# Patient Record
Sex: Male | Born: 1935 | Race: White | Hispanic: No | Smoking: Former smoker
Health system: Southern US, Community
[De-identification: ages and names within clinical notes are randomized; demographics above are authoritative.]

## PROBLEM LIST (undated history)

## (undated) DIAGNOSIS — I2510 Atherosclerotic heart disease of native coronary artery without angina pectoris: Secondary | ICD-10-CM

## (undated) DIAGNOSIS — I10 Essential (primary) hypertension: Secondary | ICD-10-CM

## (undated) DIAGNOSIS — E7800 Pure hypercholesterolemia, unspecified: Secondary | ICD-10-CM

## (undated) DIAGNOSIS — E538 Deficiency of other specified B group vitamins: Secondary | ICD-10-CM

## (undated) HISTORY — PX: COLONOSCOPY WITH ESOPHAGOGASTRODUODENOSCOPY (EGD): SHX5779

## (undated) HISTORY — PX: CARDIAC CATHETERIZATION: SHX172

## (undated) HISTORY — PX: CORONARY ANGIOPLASTY: SHX604

## (undated) HISTORY — PX: EYE SURGERY: SHX253

## (undated) HISTORY — PX: APPENDECTOMY: SHX54

---

## 2008-02-14 HISTORY — PX: EYE SURGERY: SHX253

## 2014-02-10 ENCOUNTER — Ambulatory Visit: Payer: Self-pay | Admitting: Family Medicine

## 2014-03-23 ENCOUNTER — Ambulatory Visit: Payer: Self-pay | Admitting: Family Medicine

## 2014-07-21 ENCOUNTER — Other Ambulatory Visit: Payer: Self-pay | Admitting: Gastroenterology

## 2014-07-21 DIAGNOSIS — R748 Abnormal levels of other serum enzymes: Secondary | ICD-10-CM

## 2014-09-21 ENCOUNTER — Ambulatory Visit
Admission: RE | Admit: 2014-09-21 | Discharge: 2014-09-21 | Disposition: A | Discharge status: Home / Self Care | Payer: Medicare Other | Source: Ambulatory Visit | Attending: Gastroenterology | Admitting: Gastroenterology

## 2014-09-21 DIAGNOSIS — R748 Abnormal levels of other serum enzymes: Secondary | ICD-10-CM | POA: Diagnosis not present

## 2014-10-02 ENCOUNTER — Other Ambulatory Visit: Payer: Self-pay | Admitting: Gastroenterology

## 2014-10-02 DIAGNOSIS — R935 Abnormal findings on diagnostic imaging of other abdominal regions, including retroperitoneum: Secondary | ICD-10-CM

## 2014-10-13 ENCOUNTER — Ambulatory Visit: Admission: RE | Admit: 2014-10-13 | Payer: Medicare Other | Source: Ambulatory Visit

## 2014-10-13 ENCOUNTER — Ambulatory Visit
Admission: RE | Admit: 2014-10-13 | Discharge: 2014-10-13 | Disposition: A | Discharge status: Home / Self Care | Payer: Medicare Other | Source: Ambulatory Visit | Attending: Gastroenterology | Admitting: Gastroenterology

## 2014-10-13 DIAGNOSIS — Q453 Other congenital malformations of pancreas and pancreatic duct: Secondary | ICD-10-CM | POA: Insufficient documentation

## 2014-10-13 DIAGNOSIS — I700 Atherosclerosis of aorta: Secondary | ICD-10-CM | POA: Insufficient documentation

## 2014-10-13 DIAGNOSIS — R935 Abnormal findings on diagnostic imaging of other abdominal regions, including retroperitoneum: Secondary | ICD-10-CM

## 2014-10-13 MED ORDER — IOHEXOL 350 MG/ML SOLN
100.0000 mL | Freq: Once | INTRAVENOUS | Status: AC | PRN
  Administered 2014-10-13: 100 mL via INTRAVENOUS

## 2014-10-23 ENCOUNTER — Telehealth: Payer: Self-pay

## 2014-10-23 NOTE — Telephone Encounter (Signed)
  Oncology Nurse Navigator Documentation    Navigator Encounter Type: Introductory phone call;Telephone (10/23/14 1400)   Treatment Phase: Abnormal Scans (10/23/14 1400)     Interventions: Coordination of Care (10/23/14 1400)   Coordination of Care: EUS (10/23/14 1400)        Time Spent with Patient: 30 (10/23/14 1400)   EUS scheduled for 11/19/14 with Dr Shana Chute. EUS explained. Pt denies any anticoag use. Educated on instructions for EUS and copy mailed to home address after verifying.   INSTRUCTIONS FOR ENDOSCOPIC ULTRASOUND  -Your procedure has been scheduled for October 6th with Dr Shana Chute at [facility] -The hospital will contact you to pre-register over the phone. If for any reason you have not received a call within one week prior to your scheduled procedure date, please call [phone]. -To get your scheduled arrival time, please call the Endoscopy unit at  [phone] between 1-3pm on: October 5th  -ON THE DAY OF YOU PROCEDURE:  1. If you are scheduled for a morning procedure, nothing to drink after midnight  -If you are scheduled for an afternoon procedure, you may have clear liquids until 5 hours prior  to the procedure but no carbonated drinks or broth  2. NO FOOD THE DAY OF YOUR PROCEDURE  3. You may take your heart, seizure, blood pressure, Parkinson's or breathing medications at  6am with just enough water to get your pills down  4. Do not take any oral Diabetic medications the morning of your procedure.  5. Do not take Vitamins  -On the day of your procedure, come to the [facility] Admitting/Registration desk (First desk on the right) at the scheduled arrival time. You MUST have someone drive you home from your procedure. You must have a responsible adult with a valid drivers license who is on site throughout your entire procedure and who can stay with you for several hours after your procedure. You may not go home alone in a taxi, shuttle [place] or bus, as the  drivers will not be responsible for you.

## 2014-11-18 ENCOUNTER — Encounter: Payer: Self-pay | Admitting: *Deleted

## 2014-11-19 ENCOUNTER — Encounter
Admission: RE | Disposition: A | Discharge status: Home / Self Care | Payer: Self-pay | Source: Ambulatory Visit | Attending: Internal Medicine

## 2014-11-19 ENCOUNTER — Ambulatory Visit: Payer: Medicare Other | Admitting: Anesthesiology

## 2014-11-19 ENCOUNTER — Ambulatory Visit
Admission: RE | Admit: 2014-11-19 | Discharge: 2014-11-19 | Disposition: A | Discharge status: Home / Self Care | Payer: Medicare Other | Source: Ambulatory Visit | Attending: Internal Medicine | Admitting: Internal Medicine

## 2014-11-19 ENCOUNTER — Encounter: Payer: Self-pay | Admitting: *Deleted

## 2014-11-19 DIAGNOSIS — E7800 Pure hypercholesterolemia, unspecified: Secondary | ICD-10-CM | POA: Insufficient documentation

## 2014-11-19 DIAGNOSIS — Z79899 Other long term (current) drug therapy: Secondary | ICD-10-CM | POA: Diagnosis not present

## 2014-11-19 DIAGNOSIS — I252 Old myocardial infarction: Secondary | ICD-10-CM | POA: Insufficient documentation

## 2014-11-19 DIAGNOSIS — I2510 Atherosclerotic heart disease of native coronary artery without angina pectoris: Secondary | ICD-10-CM | POA: Insufficient documentation

## 2014-11-19 DIAGNOSIS — I10 Essential (primary) hypertension: Secondary | ICD-10-CM | POA: Diagnosis not present

## 2014-11-19 DIAGNOSIS — K219 Gastro-esophageal reflux disease without esophagitis: Secondary | ICD-10-CM | POA: Diagnosis not present

## 2014-11-19 DIAGNOSIS — K862 Cyst of pancreas: Secondary | ICD-10-CM | POA: Diagnosis not present

## 2014-11-19 DIAGNOSIS — Z955 Presence of coronary angioplasty implant and graft: Secondary | ICD-10-CM | POA: Insufficient documentation

## 2014-11-19 DIAGNOSIS — Z87891 Personal history of nicotine dependence: Secondary | ICD-10-CM | POA: Diagnosis not present

## 2014-11-19 DIAGNOSIS — Z7982 Long term (current) use of aspirin: Secondary | ICD-10-CM | POA: Insufficient documentation

## 2014-11-19 HISTORY — DX: Essential (primary) hypertension: I10

## 2014-11-19 HISTORY — PX: UPPER ESOPHAGEAL ENDOSCOPIC ULTRASOUND (EUS): SHX6562

## 2014-11-19 HISTORY — DX: Pure hypercholesterolemia, unspecified: E78.00

## 2014-11-19 HISTORY — DX: Atherosclerotic heart disease of native coronary artery without angina pectoris: I25.10

## 2014-11-19 HISTORY — DX: Deficiency of other specified B group vitamins: E53.8

## 2014-11-19 SURGERY — UPPER ESOPHAGEAL ENDOSCOPIC ULTRASOUND (EUS)
Anesthesia: General

## 2014-11-19 MED ORDER — PROPOFOL 10 MG/ML IV BOLUS
INTRAVENOUS | Status: DC | PRN
  Administered 2014-11-19: 40 mg via INTRAVENOUS

## 2014-11-19 MED ORDER — FENTANYL CITRATE (PF) 100 MCG/2ML IJ SOLN
INTRAMUSCULAR | Status: DC | PRN
  Administered 2014-11-19: 50 ug via INTRAVENOUS

## 2014-11-19 MED ORDER — PROPOFOL 500 MG/50ML IV EMUL
INTRAVENOUS | Status: DC | PRN
  Administered 2014-11-19: 120 ug/kg/min via INTRAVENOUS

## 2014-11-19 MED ORDER — SODIUM CHLORIDE 0.9 % IV SOLN
INTRAVENOUS | Status: DC
  Administered 2014-11-19: 08:00:00 via INTRAVENOUS

## 2014-11-19 MED ORDER — MIDAZOLAM HCL 2 MG/2ML IJ SOLN
INTRAMUSCULAR | Status: DC | PRN
  Administered 2014-11-19: 1 mg via INTRAVENOUS

## 2014-11-19 NOTE — Anesthesia Procedure Notes (Signed)
Date/Time: 11/19/2014 8:05 AM Performed by: Junious Silk Pre-anesthesia Checklist: Patient identified, Emergency Drugs available, Suction available, Patient being monitored and Timeout performed Oxygen Delivery Method: Nasal cannula

## 2014-11-19 NOTE — Op Note (Signed)
[facility] Gastroenterology Patient Name: Ricky Russell Procedure Date: 11/19/2014 8:05 AM MRN: [id number] Account #: [id number] Date of Birth: April 03, 1935 Admit Type: Outpatient Age: 79 Room: [facility] ENDO ROOM 3 Gender: Male Note Status: Finalized Procedure:         Upper EUS Indications:       Pancreatic cyst on CT scan Patient Profile:   Refer to note in patient chart for documentation of                     history and physical. Providers:         Prudencio Pair. Katelyn Broadnax Referring MD:      Christena Deem, MD (Referring MD) Medicines:         Propofol per Anesthesia Complications:     No immediate complications. Procedure:         Pre-Anesthesia Assessment:                    Prior to the procedure, a History and Physical was                     performed, and patient medications and allergies were                     reviewed. The patient is competent. The risks and benefits                     of the procedure and the sedation options and risks were                     discussed with the patient. All questions were answered                     and informed consent was obtained. Patient identification                     and proposed procedure were verified by the physician, the                     nurse and the anesthesiologist in the endoscopy suite.                     Mental Status Examination: alert and oriented. Airway                     Examination: normal oropharyngeal airway and neck                     mobility. Respiratory Examination: clear to auscultation.                     CV Examination: normal. Prophylactic Antibiotics: The                     patient does not require prophylactic antibiotics. Prior                     Anticoagulants: The patient has taken no previous                     anticoagulant or antiplatelet agents. ASA Grade                     Assessment: III - A patient with severe systemic disease.  After  reviewing the risks and benefits, the patient was                     deemed in satisfactory condition to undergo the procedure.                     The anesthesia plan was to use monitored anesthesia care                     (MAC). Immediately prior to administration of medications,                     the patient was re-assessed for adequacy to receive                     sedatives. The heart rate, respiratory rate, oxygen                     saturations, blood pressure, adequacy of pulmonary                     ventilation, and response to care were monitored                     throughout the procedure. The physical status of the                     patient was re-assessed after the procedure.                    After obtaining informed consent, the endoscope was passed                     under direct vision. Throughout the procedure, the                     patient's blood pressure, pulse, and oxygen saturations                     were monitored continuously. The EUS GI Linear Array                     [id number] was introduced through the mouth, and advanced to                     the duodenum for ultrasound examination from the                     esophagus, stomach and duodenum. The Endoscope was                     introduced through the mouth, and advanced to the second                     part of duodenum. The upper EUS was accomplished without                     difficulty. The patient tolerated the procedure well. Findings:      Endoscopic Finding :      The examined esophagus was endoscopically normal.      The entire examined stomach was endoscopically normal.      The examined duodenum was endoscopically normal.      Endosonographic Finding :      Anechoic lesions suggestive of three cysts were identified in the neck       (  11.4 mm X 8.6 mm) and tail (8.1 mm X 3.5 mm and 3.9 mm X 3.5 mm) of the       pancreas. There was no associated mass.      There was otherwise no sign  of significant endosonographic parenchymal       or ductal abnormality in the entire pancreas. The pancreatic duct       measured 1.6 mm in the head, 1.3 mm in the neck, and 0.7 mm in the body.      There was no sign of significant endosonographic abnormality in the       common bile duct (3.8 mm), in the common hepatic duct (2.3 mm) and in       the gallbladder.      Endosonographic imaging in the left lobe of the liver showed no       abnormalities.      No lymphadenopathy seen.      The celiac region was visualized and showed no sign of significant       endosonographic abnormality. Impression:        EGD Impressions:                    - Normal esophagus.                    - Normal stomach.                    - Normal examined duodenum.                    EUS Impressions:                    - Three benign appearing small cystic lesions were seen in                     the neck and tail of the pancreas. No high risk features.                     FNA not performed given the small size and lack of high                     risk features.                    - The pancreas was otherwise normal.                    - There was no sign of significant pathology in the common                     bile duct, in the common hepatic duct and in the                     gallbladder. No stones or sludge seen.                    - Normal visualized portions of the liver.                    - Normal celiac region.                    - No lymphadenopathy seen.                    - No specimens  collected. Recommendation:    - Discharge patient to home (ambulatory).                    - Perform magnetic resonance imaging (MRI) with gadolinium                     in 1 year for surveillance of the pancreas cysts.                    - The findings and recommendations were discussed with the                     patient and his family.                    - Return to referring physician as previously  scheduled. Procedure Code(s): --- Professional ---                    [phone], Esophagogastroduodenoscopy, flexible, transoral;                     with endoscopic ultrasound examination, including the                     esophagus, stomach, and either the duodenum or a                     surgically altered stomach where the jejunum is examined                     distal to the anastomosis Diagnosis Code(s): --- Professional ---                    K86.2, Cyst of pancreas CPT copyright 2014 American Medical Association. All rights reserved. The codes documented in this report are preliminary and upon coder review may  be revised to meet current compliance requirements. Attending Participation:      I personally performed the entire procedure without the assistance of a       fellow, resident or surgical assistant. Prudencio Pair Kyanna Mahrt,  11/19/2014 8:31:18 AM This report has been signed electronically. Number of Addenda: 0 Note Initiated On: 11/19/2014 8:05 AM      [facility]

## 2014-11-19 NOTE — Transfer of Care (Signed)
Immediate Anesthesia Transfer of Care Note  Patient: Ricky Russell  Procedure(s) Performed: Procedure(s): UPPER ESOPHAGEAL ENDOSCOPIC ULTRASOUND (EUS) (N/A)  Patient Location: PACU  Anesthesia Type:General  Level of Consciousness: sedated  Airway & Oxygen Therapy: Patient Spontanous Breathing and Patient connected to nasal cannula oxygen  Post-op Assessment: Report given to RN and Post -op Vital signs reviewed and stable  Post vital signs: Reviewed and stable  Last Vitals:  Filed Vitals:   11/19/14 0714  BP: 143/85  Pulse: 64  Temp: 36.4 C  Resp: 12    Complications: No apparent anesthesia complications

## 2014-11-19 NOTE — H&P (Signed)
Ricky Russell is an 79 y.o. male.   Chief Complaint:  Pancreas cyst HPI:  Incidentally seen pancreas cyst.  For planned EUS  Past Medical History  Diagnosis Date  . Hypertension   . Coronary artery disease   . High cholesterol   . Vitamin B 12 deficiency     Past Surgical History  Procedure Laterality Date  . Appendectomy    . Colonoscopy with esophagogastroduodenoscopy (egd)    . Eye surgery      History reviewed. No pertinent family history. Social History:  reports that he has quit smoking. He has never used smokeless tobacco. He reports that he does not drink alcohol or use illicit drugs.  Allergies: No Known Allergies  Medications Prior to Admission  Medication Sig Dispense Refill  . aspirin EC 81 MG tablet Take 81 mg by mouth daily.    . metoprolol succinate (TOPROL-XL) 50 MG 24 hr tablet Take 50 mg by mouth daily. Take with or immediately following a meal.    . ofloxacin (OCUFLOX) 0.3 % ophthalmic solution 1 drop 4 (four) times daily.    . pantoprazole (PROTONIX) 40 MG tablet Take 40 mg by mouth daily.    . prednisoLONE acetate (PRED FORTE) 1 % ophthalmic suspension 1 drop 4 (four) times daily.      No results found for this or any previous visit (from the past 48 hour(s)). No results found.  ROS  Blood pressure 143/85, pulse 64, temperature 97.5 F (36.4 C), temperature source Tympanic, resp. rate 12, height  (1.778 m), weight 79.379 kg (175 lb), SpO2 100 %. Physical Exam  General: NAD Resp: CTAB Cardiovascular: RRR, no murmurs Abdomen: Soft, NT, ND, normal bowel sounds  Assessment/Plan Plan for EUS today  Mike Gip 11/19/2014, 8:00 AM

## 2014-11-19 NOTE — Anesthesia Preprocedure Evaluation (Addendum)
Anesthesia Evaluation  Patient identified by MRN, date of birth, ID band Patient awake    Reviewed: Allergy & Precautions, NPO status   History of Anesthesia Complications Negative for: history of anesthetic complications  Airway Mallampati: II       Dental  (+) Upper Dentures, Chipped, Missing   Pulmonary neg pulmonary ROS, former smoker,           Cardiovascular hypertension, + CAD, + Past MI and + Cardiac Stents       Neuro/Psych negative neurological ROS     GI/Hepatic GERD  Medicated,Pancreatic mass   Endo/Other  negative endocrine ROS  Renal/GU negative Renal ROS     Musculoskeletal   Abdominal   Peds  Hematology negative hematology ROS (+)   Anesthesia Other Findings   Reproductive/Obstetrics                            Anesthesia Physical Anesthesia Plan  ASA: III  Anesthesia Plan: General   Post-op Pain Management:    Induction: Intravenous  Airway Management Planned: Nasal Cannula  Additional Equipment:   Intra-op Plan:   Post-operative Plan:   Informed Consent: I have reviewed the patients History and Physical, chart, labs and discussed the procedure including the risks, benefits and alternatives for the proposed anesthesia with the patient or authorized representative who has indicated his/her understanding and acceptance.     Plan Discussed with:   Anesthesia Plan Comments:         Anesthesia Quick Evaluation

## 2014-11-19 NOTE — Anesthesia Postprocedure Evaluation (Signed)
  Anesthesia Post-op Note  Patient: Ricky Russell  Procedure(s) Performed: Procedure(s): UPPER ESOPHAGEAL ENDOSCOPIC ULTRASOUND (EUS) (N/A)  Anesthesia type:General  Patient location: PACU  Post pain: Pain level controlled  Post assessment: Post-op Vital signs reviewed, Patient's Cardiovascular Status Stable, Respiratory Function Stable, Patent Airway and No signs of Nausea or vomiting  Post vital signs: Reviewed and stable  Last Vitals:  Filed Vitals:   11/19/14 0900  BP: 143/68  Pulse: 61  Temp:   Resp: 17    Level of consciousness: awake, alert  and patient cooperative  Complications: No apparent anesthesia complications

## 2014-11-23 ENCOUNTER — Encounter: Payer: Self-pay | Admitting: Internal Medicine

## 2015-02-16 ENCOUNTER — Ambulatory Visit: Admit: 2015-02-16 | Payer: Medicare Other | Admitting: Gastroenterology

## 2015-02-16 SURGERY — COLONOSCOPY WITH PROPOFOL
Anesthesia: General

## 2015-11-10 ENCOUNTER — Other Ambulatory Visit: Payer: Self-pay | Admitting: Gastroenterology

## 2015-11-10 DIAGNOSIS — R945 Abnormal results of liver function studies: Principal | ICD-10-CM

## 2015-11-10 DIAGNOSIS — K869 Disease of pancreas, unspecified: Secondary | ICD-10-CM

## 2015-11-10 DIAGNOSIS — R7989 Other specified abnormal findings of blood chemistry: Secondary | ICD-10-CM

## 2015-11-23 ENCOUNTER — Ambulatory Visit
Admission: RE | Admit: 2015-11-23 | Discharge: 2015-11-23 | Disposition: A | Discharge status: Home / Self Care | Payer: Medicare Other | Source: Ambulatory Visit | Attending: Gastroenterology | Admitting: Gastroenterology

## 2015-11-23 ENCOUNTER — Other Ambulatory Visit: Payer: Self-pay | Admitting: Gastroenterology

## 2015-11-23 DIAGNOSIS — K869 Disease of pancreas, unspecified: Secondary | ICD-10-CM | POA: Insufficient documentation

## 2015-11-23 DIAGNOSIS — R945 Abnormal results of liver function studies: Secondary | ICD-10-CM

## 2015-11-23 DIAGNOSIS — R7989 Other specified abnormal findings of blood chemistry: Secondary | ICD-10-CM | POA: Insufficient documentation

## 2015-11-23 MED ORDER — GADOBENATE DIMEGLUMINE 529 MG/ML IV SOLN
15.0000 mL | Freq: Once | INTRAVENOUS | Status: AC | PRN
  Administered 2015-11-23: 15 mL via INTRAVENOUS

## 2017-03-23 NOTE — Telephone Encounter (Signed)
Had rec'd voicemail message (Thurs 02/07) from Larina Earthly at [place] office. Ms. Tresa Endo had rec'd a phone call from this Pt asking if he could possibly get an earlier appointment.  Rec'd 2nd message from Ms. Selm today.  Attempted to call Pt to verify if he would be willing to see a different Physician at the [facility] location.  LVM asking that Pt to return my call, letting us know if he wanted to see Dr. Lutricia Feil only.  Name and phone # provided.

## 2017-03-29 ENCOUNTER — Ambulatory Visit: Admit: 2017-03-29 | Discharge: 2017-04-03 | Payer: MEDICARE | Attending: Neurology

## 2017-03-29 DIAGNOSIS — G20 Parkinson's disease: Secondary | ICD-10-CM

## 2017-03-29 MED ORDER — carbidopa-levodopa (SINEMET) 25-100 mg per tablet
25-100 | ORAL_TABLET | Freq: Three times a day (TID) | ORAL | 3 refills | Status: AC
Start: 2017-03-29 — End: 2017-06-14

## 2017-03-29 NOTE — Unmapped (Signed)
Subjective:      Patient ID: William Russell is a 82 y.o. male.    Movement Disorders HPI       Handedness: Right  Chief Complaint: tremor and gait  Patient accompanied by and additional history obtained from: wife and daughter  Age at onset of symptoms: 57  Duration of symptoms: 2 years    The patient presents for an initial visit for tremor and gait difficulties with his wife and daughter. His wife noticed a tremor in his cheek a couple of years ago when he is eating or talking. He noticed a left leg tremor in 05/2016 at rest.  After this he noticed his balance started to worsen and he started taking shorter steps. [place] Turns became more difficult. He developed a tremor in right right> left hand a few months later. The hand tremor is mostly with action. He has difficulty with fine motor movements of his right hand. His handwriting is smaller. His wife feels his smell may be less sensitive. He is having more drooling. He has had dream enactment behaviors for years. He reports mild short term memory issues. He can still do his ADLS and drives without difficulty. He has developed urinary urgency and incontinence since the summer.     He has had more depression and anxiety since his wife was diagnosed with breast cancer in April of 2018.       PD Quality Metrics  Falls (Review Each Visit): present      Depression (Review Annually): present     Apathy (Review Annually): present     Anxiety (Review Annually): present     Cognitive Impairment (Review Annually): present     Orthostatic Hypotension (Review Annually): denies     Insomnia/Sleep Disturbance (Review Annually): denies       Other Symptoms  Gait Freezing: denies     Urinary Incontinence: present      Dysphagia: denies     Dream Enactment Behavior: present     Anosmia: present     Constipation: present     Hypophonia: present     Hypomimia: present     Micrographia: present        Med Side Effects  Nausea: denies     Vomiting: denies      Lightheadedness: denies     Fainting: denies     Daytime Sedation: denies     Dyskinesia: denies     Hallucinations: denies     Delusions: denies     Paranoia: denies     Impulse Control Disorder: denies       Imaging  denies    Etiol Risk  Encephalitis: denies     Medications: denies     Head Trauma: denies     Environmental Toxins: present  [place] farm       Histories:     He has a past medical history of Depression; Hypertension; and OSA (obstructive sleep apnea).    He has no past surgical history on file.    His family history includes Cancer in his mother; Coronary artery disease in his mother; Other in his father.    He reports that he has never smoked. He has never used smokeless tobacco. He reports that he does not drink alcohol or use drugs.    Review of Systems  Refer to HPI for review of systems documentation.  See scanned patient data form for additional ROS information.  Allergies:   Patient has no known allergies.    Medications:     Outpatient Encounter Prescriptions as of 03/29/2017   Medication Sig Dispense Refill   ??? aspirin 325 MG tablet Take 325 mg by mouth daily.     ??? atorvastatin (LIPITOR) 20 MG tablet TAKE 1 TABLET BY MOUTH ONE TIME A DAY AT BEDTIME  3   ??? buPROPion XL (WELLBUTRIN XL) 150 MG 24 hr tablet      ??? busPIRone (BUSPAR) 15 MG tablet Take 30 mg by mouth daily.               ??? calcium citrate-vitamin D (CITRACAL+D) 315-200 mg-unit Take 1 tablet by mouth daily.     ??? folic acid (FOLVITE) 400 MCG tablet Take 400 mcg by mouth daily.     ??? glucosamine-chondroitin 500-400 mg tablet Take 1 tablet by mouth daily.     ??? metoprolol succinate (TOPROL-XL) 25 MG 24 hr tablet Take 25 mg by mouth daily.     ??? montelukast (SINGULAIR) 10 mg tablet      ??? multivit-min/FA/lycopen/lutein (CENTRUM SILVER MEN ORAL) Take by mouth.     ??? niacin 100 MG tablet Take 100 mg by mouth daily with breakfast.     ??? omega-3 fatty acids-fish oil 300-1,000 mg capsule Take 1 capsule by mouth daily.     ???  omeprazole (PRILOSEC) 20 MG capsule      ??? PARoxetine (PAXIL) 20 MG tablet Take 20 mg by mouth every morning.     ??? saw palmetto 500 MG capsule Take 500 mg by mouth daily.       No facility-administered encounter medications on file as of 03/29/2017.        Objective:         Vitals:    03/29/17 1336   BP: 122/78   Pulse: 72   Weight: 204 lb 3.2 oz (92.6 kg)   Height: 5' 8 (1.727 m)       Neurologic Exam     Mental Status   Oriented to person, place, and time.   Oriented to place.   Oriented to year.   Speech: speech is normal   Level of consciousness: arousable by verbal stimuli ,  responsive to painful stimuli    Cranial Nerves     CN III, IV, VI   Pupils are equal, round, and reactive to light.  Extraocular motions are normal.     CN VIII   Hearing: impaired    Motor Exam     Strength   Strength 5/5 throughout. Tremor of jaw and right face/cheek which worsens with smile.      Sensory Exam   Light touch normal.     Gait, Coordination, and Reflexes     Gait  Gait: (decreased stride length, decreased arm swing)    Coordination   Finger to nose coordination: normal    Tremor   Resting tremor: present    Reflexes   Right brachioradialis: 2+  Left brachioradialis: 2+  Right biceps: 2+  Left biceps: 2+  Right patellar: 3+  Left patellar: 3+  Right achilles: 2+  Left achilles: 2+  Right ankle clonus: absent  Left ankle clonus: absent        Physical Exam   Constitutional: He is oriented to person, place, and time. He appears well-developed and well-nourished.   HENT:   Head: Normocephalic and atraumatic.   Eyes: Pupils are equal, round, and reactive to light. Conjunctivae and EOM  are normal.   Neck: Normal range of motion.   Pulmonary/Chest: Effort normal. No respiratory distress.   Abdominal: He exhibits no distension.   Musculoskeletal: Normal range of motion. He exhibits no edema.   Neurological: He is oriented to person, place, and time. He has normal strength. He has a normal Finger-Nose-Finger Test.   Reflex  Scores:       Bicep reflexes are 2+ on the right side and 2+ on the left side.       Brachioradialis reflexes are 2+ on the right side and 2+ on the left side.       Patellar reflexes are 3+ on the right side and 3+ on the left side.       Achilles reflexes are 2+ on the right side and 2+ on the left side.  Skin: Skin is warm and dry.   Psychiatric: He has a normal mood and affect. His speech is normal.       MDS UPDRS part 3 - Motor Examination     General  3.1 Speech: 1  3.2 Facial Expression: 1  Rigidity  3.3a Rigidity- Neck: 2  3.3b Rigidity right upper: 1  3.3c Rigidity left upper: 1  3.3d Rigidity right lower: 0  3.3e Rigidity left lower: 0  Coordination   3.4a Finger tapping, right : 2  3.4b Finger tapping, left: 3  3.5a Hand movement, right: 3  3.5b Hand movement, left: 2  3.6a Pronation-supination of right hand: 2  3.6b Pronation-supination of left hand: 2  3.7a Toe tapping, right : 1  3.7b Toe Tapping, left: 2  3.8a Leg agility, right: 1  3.8b Leg agility, left: 1  Posture and Gait  3.9 Arising from chair : 0  3.10 Gait: 1  3.11 Freezing of gait: 0  3.12 Postural Stability: 0  3.13 Posture: 1  3.14 Body bradykinesia: 2  Tremor  3.15a Postural tremor, right hand: 0  3.15b Postural Tremor, left hand: 0  3.16a Kinetic Tremor of right hand: 1  3.16b Kinetic tremor of left hand: 1  3.17e Lip/jaw rest tremor: 1  3.17a RUE Rest Tremor Amplitude: 0  3.17b LUE Rest Tremor Amplitude: 0  3.17c RLE Rest Tremor Amplitude: 1  3.17d LLE Rest Tremor Amplitude: 0  3.17e Constancy of Rest tremor: 1     MDS UPDRS 3 Total: 34        EMG 08/2012: IMPRESSION: Above findings are suggestive of a 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 There is evidence of axonal loss along the right abductor pollicis brevis. There is no EMG evidence of cervical radiculopathy. There is no evidence of brachial plexopathy. There is no evidence of generalized  peripheral neuropathy, myopathy, or motor neuron disease on today's examination. The patient tolerated the procedure well.       Assessment:     1. Idiopathic Parkinson's disease (CMS Dx)      The patient has parkinsonism in the form of asymmetric resting tremor, bradykinesia and mild rigidity with symptom onset about 1 year ago with an unknown response to levodopa which in the absence of atypical features likely represents idiopathic Parkinson's disease. 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     Plan:   1. Start Sinemet 25/100 mg 1/2 tablet TID and increase by 1/2 tablet per dose every 2 weeks until taking 2 tablets TID. Discussed potential side effects  to be aware of.   2. Encouraged regular exercise.   3. If he does not respond to Sinemet or develops atypical features would recommend brain MRI as he has not had any brain imaging to date.   4. We did not have time to do a MoCA today however will do at a future visit to assess cognition further (patient only with mild short term memory complaints currently, no impact on ADLs).   5. Follow-up with Shela Nevin CNP in 10 weeks.            I have spent 90 minutes face to face with this patient with greater than 50% of this time spent in counseling and coordination of care discussing the above issues.  See Assessment and Plan for details.

## 2017-04-18 NOTE — Unmapped (Signed)
Please fax urology referral to [facility] at [phone] (I placed referral in epic).

## 2017-04-18 NOTE — Telephone Encounter (Signed)
Urology referral was faxed to [facility] and a fax confirmation was received.

## 2017-04-24 ENCOUNTER — Ambulatory Visit

## 2017-06-14 ENCOUNTER — Ambulatory Visit: Admit: 2017-06-14 | Discharge: 2017-08-23 | Payer: MEDICARE | Attending: Adult Health

## 2017-06-14 DIAGNOSIS — G20 Parkinson's disease: Secondary | ICD-10-CM

## 2017-06-14 MED ORDER — polyethylene glycol (GLYCOLAX) 17 gram/dose powder
17 | Freq: Every day | ORAL | 0 refills | 14.00000 days | Status: AC
Start: 2017-06-14 — End: 2018-03-25

## 2017-06-14 MED ORDER — carbidopa-levodopa (SINEMET) 25-100 mg per tablet
25-100 | ORAL_TABLET | Freq: Three times a day (TID) | ORAL | 3 refills | Status: AC
Start: 2017-06-14 — End: 2017-11-20

## 2017-06-14 NOTE — Unmapped (Signed)
Subjective:      Patient ID: William Russell is a 82 y.o. male.    Movement Disorders HPI       Handedness: Right  Chief Complaint: tremor and gait  Patient accompanied by and additional history obtained from: wife and daughter  Age at onset of symptoms: 81  Duration of symptoms: 2 years    William Russell presents for followup.  He last saw Dr. Gerilyn Pilgrim in February for initial evaluation of tremor and gait difficulties. During that visit we started Sinemet 2 tablets three times a day. He says the tremor in his cheek, lower extremity and hand tremor has improved. He says he can button his shirt better.  His wife says she has to him put his coat on. He says his balance is better.  He says he has a internal tremor. He says his handwriting has not improved. He says it is small.  He says has he has occasional lightheaded if he gets up too quickly. He has wearing off 30 minutes before the next dose with staggering gait, internal tremor and stiffness.     He goes to PT and PD exercise class 3 days a week and finds this has helped his balance and gait. He denies any new falls.    He has occasional incontinence and urgency incontinence that is quite bothersome. He has constipation. He says his short-term memory is bad. 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         Initial evaluation:  The patient presents for an initial visit for tremor and gait difficulties with his wife and daughter. His wife noticed a tremor in his cheek a couple of years ago when he is eating or talking. He noticed a left leg tremor in 05/2016 at rest.  After this he noticed his balance started to worsen and he started taking shorter steps. [place] Turns became more difficult. He developed a tremor in right right> left hand a few months later. The hand tremor is mostly with action. He has difficulty with fine motor movements of his right hand. His handwriting is smaller. His wife feels his smell may be less  sensitive. He is having more drooling. He has had dream enactment behaviors for years. He reports mild short term memory issues. He can still do his ADLS and drives without difficulty. He has developed urinary urgency and incontinence since the summer.   ??  He has had more depression and anxiety since his wife was diagnosed with breast cancer in April of 2018.     PD Quality Metrics  Falls (Review Each Visit): present           Depression (Review Annually): present     Apathy (Review Annually): present     Anxiety (Review Annually): present     Cognitive Impairment (Review Annually): present  short term memory  Orthostatic Hypotension (Review Annually): denies     Insomnia/Sleep Disturbance (Review Annually): denies             Other Symptoms  Gait Freezing: denies           Urinary Incontinence: present  incontinence and urgency        Dysphagia: denies     Dream Enactment Behavior: present     Anosmia: present     Constipation: present     Hypophonia: present     Hypomimia: present     Micrographia:  present        Med Side Effects  Nausea: denies     Vomiting: denies     Lightheadedness: denies     Fainting: denies     Daytime Sedation: denies     Dyskinesia: denies     Hallucinations: denies     Delusions: denies     Paranoia: denies     Impulse Control Disorder: denies       Motor Fluctuations        Wearing Off Prior to Next Dose: 30 minutes    Imaging  denies       Histories:     He has a past medical history of Depression; Hypertension; and OSA (obstructive sleep apnea).    He has no past surgical history on file.    His family history includes Cancer in his mother; Coronary artery disease in his mother; Other in his father.    He reports that he has never smoked. He has never used smokeless tobacco. He reports that he does not drink alcohol or use drugs.    Review of Systems  Refer to HPI for review of systems documentation.  ROS was otherwise non-contributory.     Allergies:   Patient has no known  allergies.    Medications:     Outpatient Encounter Prescriptions as of 06/14/2017   Medication Sig Dispense Refill   ??? allopurinol (ZYLOPRIM) 300 MG tablet Take 300 mg by mouth daily.     ??? aspirin 325 MG tablet Take 325 mg by mouth daily.     ??? atorvastatin (LIPITOR) 20 MG tablet TAKE 1 TABLET BY MOUTH ONE TIME A DAY AT BEDTIME  3   ??? buPROPion XL (WELLBUTRIN XL) 150 MG 24 hr tablet      ??? calcium citrate-vitamin D (CITRACAL+D) 315-200 mg-unit Take 1 tablet by mouth daily.     ??? carbidopa-levodopa (SINEMET) 25-100 mg per tablet Take 3 tablets by mouth 3 times a day. 810 tablet 3   ??? fluticasone propionate (FLONASE) 50 mcg/actuation nasal spray Use 1 spray into each nostril at bedtime.     ??? folic acid (FOLVITE) 400 MCG tablet Take 400 mcg by mouth daily.     ??? glucosamine-chondroitin 500-400 mg tablet Take 1 tablet by mouth daily.     ??? loratadine (CLARITIN) 10 mg tablet Take 10 mg by mouth daily as needed for Allergies.     ??? metoprolol succinate (TOPROL-XL) 25 MG 24 hr tablet Take 25 mg by mouth daily.     ??? montelukast (SINGULAIR) 10 mg tablet      ??? multivit-min/FA/lycopen/lutein (CENTRUM SILVER MEN ORAL) Take by mouth.     ??? niacin 100 MG tablet Take 100 mg by mouth daily with breakfast.     ??? omega-3 fatty acids-fish oil 300-1,000 mg capsule Take 1 capsule by mouth daily.     ??? omeprazole (PRILOSEC) 20 MG capsule      ??? PARoxetine (PAXIL) 20 MG tablet Take 20 mg by mouth every morning.     ??? saw palmetto 500 MG capsule Take 500 mg by mouth daily.     ??? [DISCONTINUED] carbidopa-levodopa (SINEMET) 25-100 mg per tablet Take 2 tablets by mouth 3 times a day. 540 tablet 3   ??? busPIRone (BUSPAR) 15 MG tablet Take 30 mg by mouth daily.               ??? polyethylene glycol (GLYCOLAX) 17 gram/dose powder Take 17 g by mouth daily. 255  g 0     No facility-administered encounter medications on file as of 06/14/2017.        Objective:         Vitals:    06/14/17 1306   BP: 122/70   Pulse: 67   Weight: 213 lb 9.6 oz (96.9 kg)    Height: 5' 8 (1.727 m)       MDS UPDRS part 3 - Motor Examination  MDS UPDRS III Motor Examination  Date medication last taken: 06/14/17  Time medication last taken : 1415  Medication : OFF  General  3.1 Speech: 1  3.2 Facial Expression: 1  Rigidity  3.3a Rigidity- Neck: 2  3.3b Rigidity right upper: 1  3.3c Rigidity left upper: 0  3.3d Rigidity right lower: 0  3.3e Rigidity left lower: 0  Coordination   3.4a Finger tapping, right : 2  3.4b Finger tapping, left: 3  3.5a Hand movement, right: 2  3.5b Hand movement, left: 2  3.6a Pronation-supination of right hand: 2  3.6b Pronation-supination of left hand: 2  3.7a Toe tapping, right : 1  3.7b Toe Tapping, left: 2  3.8a Leg agility, right: 1  3.8b Leg agility, left: 1  Posture and Gait  3.9 Arising from chair : 0  3.10 Gait: 1  3.11 Freezing of gait: 0  3.12 Postural Stability: 1  3.13 Posture: 1  3.14 Body bradykinesia: 2  Tremor  3.15a Postural tremor, right hand: 0  3.15b Postural Tremor, left hand: 1  3.16a Kinetic Tremor of right hand: 0  3.16b Kinetic tremor of left hand: 1  3.17e Lip/jaw rest tremor: 0  3.17a RUE Rest Tremor Amplitude: 0  3.17b LUE Rest Tremor Amplitude: 0  3.17c RLE Rest Tremor Amplitude: 0  3.17d LLE Rest Tremor Amplitude: 1  3.17e Constancy of Rest tremor: 1     MDS UPDRS 3 Total: 32  Comments: decreased bilateral arm swing       Assessment:     1. Parkinson's disease (CMS Dx)    2. Urinary incontinence, unspecified type    3. Urgency incontinence    4. 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 He has rigidity, mild tremor and shugffling gait that warrants  an increase in Sinemet.  He has urinary incontinence and urgency that are bothersome. He has short-term memory concerns. His MOCA today is 22/30. He has constipation.     Plan:     1.  Increase Sinemet from 2 tabs three times a day to 3 tabs three times a day, to help with rigidity, bradykinesia and tremor.  2.  Continue PT and PD exercises as he is doing to help with balance, gait, endurance. We discussed the importance of exercise in Parkinson's Disease, and that regular physical activity will help the symptoms of the disease and it is possible that it could slow the progression of the disease overtime. He verbalized understanding.  3.  Medical message referral provided to help with rigidity.  4.  Urology referral to Dr. Ree Shay provided.  5.  Miralax 1 cap full daily as needed for constipation.  6.  Follow-up with Lenn Sink, CNP  in 3 months and Dr. Gerilyn Pilgrim in 6 months.        I have spent 90 minutes face to face with this patient with greater than 50% of this time spent in counseling and coordination of care discussing the above issues.  See Assessment and Plan for details.    Lenn Sink, CNP

## 2017-06-14 NOTE — Patient Instructions (Addendum)
1.  Increase Sinemet to 3 tabs three times a day   Week one Sinemet 2.5 tab three times a day  Week two sinemet 3 tabs three time a day  2.  Urology referrral to Dr. Ree Shay  3.  Miralax 1 cap a daily to help with constipation  4. Continue exercising as he is doing  5.  Medical message therapy  6. Pt for for balance, endurance, and mobility.  7. Follow-up with Shela Nevin in 3 months and Dr. Gerilyn Pilgrim in 6 months.

## 2017-07-05 NOTE — Telephone Encounter (Signed)
Zollie Scale PT fr [facility], is calling to inform that pt is having other concerns and would benefit from getting OT as well. Asking for an OT order to be faxed.         Fax [phone]

## 2017-07-06 NOTE — Telephone Encounter (Signed)
OT referral has been faxed over to [facility] with good confirmation, call complete.

## 2017-07-20 NOTE — Telephone Encounter (Signed)
error 

## 2017-09-20 ENCOUNTER — Ambulatory Visit: Admit: 2017-09-20 | Payer: MEDICARE | Attending: Adult Health

## 2017-09-20 DIAGNOSIS — G20 Parkinson's disease: Secondary | ICD-10-CM

## 2017-09-20 NOTE — Unmapped (Signed)
Subjective:      Patient ID: William Russell is a 82 y.o. male.    Movement Disorders HPI       Handedness: Right  Chief Complaint: tremor and gait  Patient accompanied by and additional history obtained from: wife and daughter  Age at onset of symptoms: 54  Duration of symptoms: 2 years    William Russell presents for followup.  He last seen in June and at that visit we increased the Sinemet from 2 tabs three times a day to 3 tabs three times a day (6-7a, 12-2p, 7-10p). He says his balance and posture has improved. He reports the tremor in his cheek, right hand tremor has improved. He recently started using weighted utensils and he finds it helpful. His handwriting has improved.  He has a left lower extremity internal tremor that is bothersome.  He says has he has occasional lightheaded if he gets up too quickly. He has occasional wearing if late taking the next dose with return of internal tremor.     He goes to PT/OT and [employer] exercise class 3 days a week and finds this helpful. He denies any new falls.   ??  He has urgency incontinence. He has an appointment with William Russell soon. He has constipation that is bothersome. He says his short-term memory is bad. 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    He had one ER visit in June for abdominal pain. He is scheduled for a cholecystectomy on August 26 th.    He is interested in Parkinson's Disease clinic trials.     Initial evaluation:  The patient presents for an initial visit for tremor and gait difficulties with his wife and daughter. His wife noticed a tremor in his cheek a couple of years ago when he is eating or talking. He noticed a left leg tremor in 05/2016 at rest. ??After this he noticed his balance started to worsen and he started taking shorter steps. He has had 3 falls [place] his farm. Turns became more difficult. He developed a tremor in right right>??left hand a few months later. The hand tremor  is mostly with action. He has difficulty with fine motor movements of his right hand. His handwriting is smaller. His wife feels his smell may be less sensitive. He is having more drooling. He has had dream enactment behaviors for years. He reports mild short term memory issues. He can still do his ADLS and drives without difficulty. He has developed urinary urgency and incontinence since the summer.   ??  He has had more depression and anxiety since his wife was diagnosed with breast cancer in April of 2018.       PD Quality Metrics  Falls (Review Each Visit): present  in the past  Motor Complications (Review Each Visit): present  internal tremor  Depression (Review Annually): present     Apathy (Review Annually): present     Anxiety (Review Annually): present     Cognitive Impairment (Review Annually): present  short term memory  Orthostatic Hypotension (Review Annually): denies     Insomnia/Sleep Disturbance (Review Annually): denies     PD Dx Review (Review Annually): yes     PD Med and Surg Option (Review Annually): yes     PD Rehab Options (Review Annually): yes     PD Safety Counseling (Review Annually): yes       Other  Symptoms  Gait Freezing: denies     Excessive Sweating: denies     Urinary Incontinence: present  urgency        Dysphagia: denies     Dream Enactment Behavior: present     Anosmia: present     Constipation: present     Hypophonia: present     Hypomimia: present     Micrographia: present        Med Side Effects  Nausea: denies     Vomiting: denies     Lightheadedness: denies     Fainting: denies     Daytime Sedation: denies     Dyskinesia: denies     Hallucinations: denies     Delusions: denies     Paranoia: denies     Impulse Control Disorder: denies       Motor Fluctuations        Wearing Off Prior to Next Dose: if late with the next dose.     Imaging  denies       Histories:     He has a past medical history of Depression; Hypertension; and OSA (obstructive sleep apnea).    He has no past  surgical history on file.    His family history includes Cancer in his mother; Coronary artery disease in his mother; Other in his father.    He reports that he has never smoked. He has never used smokeless tobacco. He reports that he does not drink alcohol or use drugs.    Review of Systems  Refer to HPI for review of systems documentation.  ROS was otherwise non-contributory.     Allergies:   Patient has no known allergies.    Medications:     Outpatient Encounter Prescriptions as of 09/20/2017   Medication Sig Dispense Refill   ??? allopurinol (ZYLOPRIM) 300 MG tablet Take 300 mg by mouth daily.     ??? aspirin 325 MG tablet Take 325 mg by mouth daily.     ??? atorvastatin (LIPITOR) 20 MG tablet TAKE 1 TABLET BY MOUTH ONE TIME A DAY AT BEDTIME  3   ??? buPROPion XL (WELLBUTRIN XL) 150 MG 24 hr tablet      ??? calcium citrate-vitamin D (CITRACAL+D) 315-200 mg-unit Take 1 tablet by mouth daily.     ??? carbidopa-levodopa (SINEMET) 25-100 mg per tablet Take 3 tablets by mouth 3 times a day. 810 tablet 3   ??? fluticasone propionate (FLONASE) 50 mcg/actuation nasal spray Use 1 spray into each nostril at bedtime.     ??? folic acid (FOLVITE) 400 MCG tablet Take 400 mcg by mouth daily.     ??? glucosamine-chondroitin 500-400 mg tablet Take 1 tablet by mouth daily.     ??? loratadine (CLARITIN) 10 mg tablet Take 10 mg by mouth daily as needed for Allergies.     ??? metoprolol succinate (TOPROL-XL) 25 MG 24 hr tablet Take 25 mg by mouth daily.     ??? montelukast (SINGULAIR) 10 mg tablet      ??? multivit-min/FA/lycopen/lutein (CENTRUM SILVER MEN ORAL) Take by mouth.     ??? niacin 100 MG tablet Take 200 mg by mouth 2 times a day with meals.            ??? omega-3 fatty acids-fish oil 300-1,000 mg capsule Take 1 capsule by mouth daily.     ??? omeprazole (PRILOSEC) 20 MG capsule      ??? PARoxetine (PAXIL) 20 MG tablet Take 20 mg by mouth every morning.     ???  polyethylene glycol (GLYCOLAX) 17 gram/dose powder Take 17 g by mouth daily. 255 g 0   ??? saw  palmetto 500 MG capsule Take 500 mg by mouth daily.       No facility-administered encounter medications on file as of 09/20/2017.        Objective:         Vitals:    09/20/17 1309   BP: (P) 122/62   Pulse: (P) 70   Weight: 194 lb (88 kg)   Height: 5' 8 (1.727 m)       MDS UPDRS part 3 - Motor Examination  MDS UPDRS III Motor Examination  Date medication last taken: 09/20/17  Time medication last taken : 1250  Medication taken : Sinemet  Medication : ON  General  3.1 Speech: 1  3.2 Facial Expression: 1  Rigidity  3.3a Rigidity- Neck: 1  3.3b Rigidity right upper: 1  3.3c Rigidity left upper: 0  3.3d Rigidity right lower: 0  3.3e Rigidity left lower: 0  Coordination   3.4a Finger tapping, right : 2  3.4b Finger tapping, left: 3  3.5a Hand movement, right: 1  3.5b Hand movement, left: 1  3.6a Pronation-supination of right hand: 1  3.6b Pronation-supination of left hand: 2  3.7a Toe tapping, right : 1  3.7b Toe Tapping, left: 2  3.8a Leg agility, right: 1  3.8b Leg agility, left: 2  Posture and Gait  3.9 Arising from chair : 0  3.10 Gait: 1  3.11 Freezing of gait: 0  3.12 Postural Stability: 1 (3 steps)  3.13 Posture: 1  3.14 Body bradykinesia: 1  Tremor  3.15a Postural tremor, right hand: 0  3.15b Postural Tremor, left hand: 1  3.16a Kinetic Tremor of right hand: 0  3.16b Kinetic tremor of left hand: 1  3.17e Lip/jaw rest tremor: 0  3.17a RUE Rest Tremor Amplitude: 0  3.17b LUE Rest Tremor Amplitude: 0  3.17c RLE Rest Tremor Amplitude: 0  3.17d LLE Rest Tremor Amplitude: 1 (twitching of his calf)  3.17e Constancy of Rest tremor: 1     MDS UPDRS 3 Total: 28  Comments: improved bilateral arm swing       Assessment:     1. Parkinson's disease (CMS Dx)    2. Urgency incontinence    3. 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 He has mild internal tremor that is variable, mostly if late taking a dose of Sinemet.  He has urinary urgency that is bothersome. He has short-term memory concerns. His MOCA is 22/30. He has constipation that has improved on Miralax.       Plan:     1. Continue Sinemet 3 tablets three times a day. Encouraged to take the Sinemet at normal times and regular intervals.  2. Continue exercise as he is doing. We discussed the importance of exercise in Parkinson's Disease, and that regular physical activity will help the symptoms of the disease and it is possible that it could slow the progression of the disease overtime. He verbalized understanding.  3.  He is interested in PD clinical trials. Will coordinated with our research team.  4.  Follow-up with Dr. Gerilyn Pilgrim in 3 months.          I have spent 45 minutes face to face with this patient with greater than 50% of this time spent in counseling and coordination of care discussing the above issues.  See Assessment and Plan for details.    Lenn Sink, CNP

## 2017-09-20 NOTE — Unmapped (Addendum)
1.  Continue Sinemet 3 tablets three times a day. Encouraged to take the Sinemet at normal times and regular intervals. 7a-12p-5p.  2.  Continue Miralax as he is doing.  3. Continue exercising as he doing.  4. Avoid anti nausea medications such as phenergan and compazine. If post-op nausea is present please treat with Zofran.

## 2017-10-22 ENCOUNTER — Ambulatory Visit: Admit: 2017-10-22 | Payer: MEDICARE

## 2017-10-22 DIAGNOSIS — G20 Parkinson's disease: Secondary | ICD-10-CM

## 2017-10-22 LAB — POC URINALYSIS
Bilirubin, UA: NEGATIVE mg/dL
Blood, POC, UA: NEGATIVE
Glucose, POC, UA: NEGATIVE mg/dL
Ketones, POC, UA: NEGATIVE mg/dL
Leukocytes, POC, UA: NEGATIVE
Nitrite, POC, UA: NEGATIVE
Protein, POC, UA: NEGATIVE mg/dL
Spec Grav, UA: 1.015 (ref 1.005–1.035)
Urobilinogen, POC, UA: 0.2 EU/dL (ref 0.2–1.0)
pH, POC,UA: 7 (ref 5.0–8.0)

## 2017-10-22 MED ORDER — finasteride (PROSCAR) 5 mg tablet
5 | ORAL_TABLET | Freq: Every day | ORAL | 0 refills | Status: AC
Start: 2017-10-22 — End: 2017-12-21

## 2017-10-22 MED ORDER — trospium (SANCTURA XR) 60 mg Cp24
60 | ORAL_CAPSULE | Freq: Every day | ORAL | 0 refills | Status: AC
Start: 2017-10-22 — End: 2017-12-24

## 2017-10-22 MED ORDER — tamsulosin (FLOMAX) 0.4 mg Cap
0.4 | ORAL_CAPSULE | Freq: Every evening | ORAL | 0 refills | Status: AC
Start: 2017-10-22 — End: 2017-12-21

## 2017-10-22 NOTE — Progress Notes (Signed)
Chief Complaint   Patient presents with    New Patient Visit/ Consultation        History of Present Illness  81y/o male with history of PD (03/2017), OSA, constipation, depression.    Reports urinary urgency and UUI for the last several months.   Frequency 3-4x daily. Nocturia 1x.   Small volume urinary leakage 4-5x daily. Uses pads 60% of days. Changes approx once daily. Always change pad before going to bed.   Denies dysuria, abdominal pain, hesitancy. Strong urine stream.   Feels like he does not fully empty his bladder with urination, and has to bear down to completely empty at the end of urination.     Reports constipation due to carvidopa.     PVR today 16       Review of Systems   Constitutional: Negative for chills, diaphoresis, fatigue and fever.   Respiratory: Negative for cough, chest tightness, shortness of breath and wheezing.    Cardiovascular: Negative for chest pain, palpitations and leg swelling.   Gastrointestinal: Positive for constipation. Negative for abdominal distention, abdominal pain, diarrhea, heartburn and nausea.   Genitourinary: Positive for frequency, nocturia and urgency. Negative for dysuria, flank pain, hematuria, scrotal swelling and testicular pain.   Musculoskeletal: Positive for arthralgias. Negative for back pain, gait problem, joint swelling and myalgias.       Allergies  Patient has no known allergies.    Medications  Outpatient Encounter Prescriptions as of 10/22/2017   Medication Sig Dispense Refill    allopurinol (ZYLOPRIM) 300 MG tablet Take 300 mg by mouth daily.      aspirin 325 MG tablet Take 325 mg by mouth daily.      atorvastatin (LIPITOR) 20 MG tablet TAKE 1 TABLET BY MOUTH ONE TIME A DAY AT BEDTIME  3    buPROPion XL (WELLBUTRIN XL) 150 MG 24 hr tablet       calcium citrate-vitamin D (CITRACAL+D) 315-200 mg-unit Take 1 tablet by mouth daily.      carbidopa-levodopa (SINEMET) 25-100 mg per tablet Take 3 tablets by mouth 3 times a day. 810 tablet 3     fluticasone propionate (FLONASE) 50 mcg/actuation nasal spray Use 1 spray into each nostril at bedtime.      folic acid (FOLVITE) 400 MCG tablet Take 400 mcg by mouth daily.      glucosamine-chondroitin 500-400 mg tablet Take 1 tablet by mouth daily.      loratadine (CLARITIN) 10 mg tablet Take 10 mg by mouth daily as needed for Allergies.      metoprolol succinate (TOPROL-XL) 25 MG 24 hr tablet Take 25 mg by mouth daily.      montelukast (SINGULAIR) 10 mg tablet       multivit-min/FA/lycopen/lutein (CENTRUM SILVER MEN ORAL) Take by mouth.      niacin 100 MG tablet Take 200 mg by mouth 2 times a day with meals.             omega-3 fatty acids-fish oil 300-1,000 mg capsule Take 1 capsule by mouth daily.      omeprazole (PRILOSEC) 20 MG capsule       PARoxetine (PAXIL) 20 MG tablet Take 20 mg by mouth every morning.      saw palmetto 500 MG capsule Take 500 mg by mouth daily.      polyethylene glycol (GLYCOLAX) 17 gram/dose powder Take 17 g by mouth daily. 255 g 0     No facility-administered encounter medications on file as of 10/22/2017.  Histories  He has a past medical history of Depression; Hypertension; and OSA (obstructive sleep apnea).    He has a past surgical history that includes Shoulder surgery; Lumbar spine surgery; Cholecystectomy; and Carpal tunnel release.    His family history includes Cancer in his mother; Coronary artery disease in his mother; Other in his father.    He reports that he has never smoked. He has never used smokeless tobacco. He reports that he does not drink alcohol or use drugs.    The following portions of the patient's history were reviewed and updated as appropriate: allergies, current medications, past family history, past medical history, past social history, past surgical history and problem list.    Blood pressure 112/62, pulse 77, height 5' 7.5 (1.715 m), weight 194 lb (88 kg).  Physical Exam   Vitals reviewed.  Constitutional: He is oriented to person,  place, and time. He appears well-developed and well-nourished.   HENT:   Head: Normocephalic and atraumatic.   Eyes: Pupils are equal, round, and reactive to light.   Neck: Normal range of motion. Neck supple. No tracheal deviation present.   Cardiovascular: Normal rate, regular rhythm and normal heart sounds.    Pulmonary/Chest: Effort normal and breath sounds normal. No respiratory distress. He has no wheezes. He has no rales. He exhibits no tenderness.   Abdominal: Soft. Bowel sounds are normal. He exhibits no distension and no mass. There is no tenderness. There is no rebound.   Genitourinary: Uncircumcised.   Genitourinary Comments: Small R hydrocele   Musculoskeletal: Normal range of motion.   Neurological: He is alert and oriented to person, place, and time.   Skin: Skin is warm and dry. No rash noted. No erythema.   Psychiatric: He has a normal mood and affect. His behavior is normal.     DRE: Moderately enlarged prostate.         Assessment  81y/o male with history of PD (03/2017), OSA, constipation, depression referred for UUI.     Reports urinary urgency and urinary leakage for the last several months.   Frequency 3-4x daily. Nocturia 1x.   Small volume urinary leakage 4-5x daily. Uses pads 60% of days. Changes approx once daily. Always change pad before going to bed.   Denies dysuria, abdominal pain, hesitancy. Strong urine stream.   Feels like he does not fully empty his bladder with urination, and has to bear down to completely empty at the end of urination.     DRE: Moderately enlarged prostate.   PVR: 16      Plan  1. Prostate is moderately enlarged on DRE. Prescribed flomax and proscar today.   2. Prescribed trospium to address urinary urgency.   3. Discussed bladder diet with patient and provided information regarding foods than can irritate bladder.   4. Patient will complete voiding diary and bring to next appointment. Will evaluate to see if there are any behavior modifications that may help with  his symptoms.   5. RTC 4-6 weeks. If he is doing well, we will continue current management. If not, will plan for cystoscopy to further evaluate bladder and prostate.        Medical Decision Making  The following items were considered in medical decision making:  Review / order clinical lab tests  Reviewed outside records

## 2017-11-04 ENCOUNTER — Emergency Department: Payer: Medicare Other

## 2017-11-04 ENCOUNTER — Emergency Department
Admission: EM | Admit: 2017-11-04 | Discharge: 2017-11-04 | Disposition: A | Discharge status: Home / Self Care | Payer: Medicare Other | Attending: Emergency Medicine | Admitting: Emergency Medicine

## 2017-11-04 ENCOUNTER — Other Ambulatory Visit: Payer: Self-pay

## 2017-11-04 DIAGNOSIS — Z79899 Other long term (current) drug therapy: Secondary | ICD-10-CM | POA: Insufficient documentation

## 2017-11-04 DIAGNOSIS — Z7982 Long term (current) use of aspirin: Secondary | ICD-10-CM | POA: Insufficient documentation

## 2017-11-04 DIAGNOSIS — Z87891 Personal history of nicotine dependence: Secondary | ICD-10-CM | POA: Diagnosis not present

## 2017-11-04 DIAGNOSIS — M25562 Pain in left knee: Secondary | ICD-10-CM | POA: Diagnosis present

## 2017-11-04 DIAGNOSIS — M25462 Effusion, left knee: Secondary | ICD-10-CM | POA: Diagnosis not present

## 2017-11-04 DIAGNOSIS — Y9389 Activity, other specified: Secondary | ICD-10-CM | POA: Diagnosis not present

## 2017-11-04 DIAGNOSIS — I2510 Atherosclerotic heart disease of native coronary artery without angina pectoris: Secondary | ICD-10-CM | POA: Insufficient documentation

## 2017-11-04 DIAGNOSIS — I10 Essential (primary) hypertension: Secondary | ICD-10-CM | POA: Insufficient documentation

## 2017-11-04 DIAGNOSIS — M7052 Other bursitis of knee, left knee: Secondary | ICD-10-CM | POA: Insufficient documentation

## 2017-11-04 MED ORDER — PREDNISONE 10 MG PO TABS: ORAL_TABLET | ORAL | 0 refills | Status: AC

## 2017-11-04 MED ORDER — HYDROCODONE-ACETAMINOPHEN 5-325 MG PO TABS: 1.0000 | ORAL_TABLET | Freq: Four times a day (QID) | ORAL | 0 refills | Status: AC | PRN

## 2017-11-04 NOTE — Discharge Instructions (Signed)
Follow-up with your primary care provider or make an appointment with the orthopedist listed on your discharge papers or orthopedist of your choice.  Begin taking prednisone 3 tablets once a day for the next 5 days.  Try to take this medication approximately the same time every day.  Norco 1 tablet every 6 hours as needed for pain.  Be aware that this medication could cause drowsiness and increase her risk for injury.  Do not take this medication and drive or operate machinery.  You may also ice and elevate your knee to reduce swelling which will help with pain.

## 2017-11-04 NOTE — ED Triage Notes (Signed)
Pt c/o left knee pain since Friday. 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

## 2017-11-04 NOTE — ED Provider Notes (Signed)
[facility] Provider Note   ____________________________________________   First MD Initiated Contact with Patient 11/04/17 [phone]     (approximate)  I have reviewed the triage vital signs and the nursing notes.   HISTORY  Chief Complaint Knee Pain   HPI Ricky Russell is a 82 y.o. male Mod[place] Jansky to the ED with complaint of left knee pain.  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  He has been noticing a clicking noise in the past and has had problems.  This first time he has been seen for his knee issues.  His knee is never been x-rayed.  Patient states that last evening pain was increased and he was unable to sleep.  Patient continues to be ambulatory without assistance.  Rates pain as a 10/10 last evening.   Past Medical History:  Diagnosis Date  . Coronary artery disease   . High cholesterol   . Hypertension   . Vitamin B 12 deficiency     There are no active problems to display for this patient.   Past Surgical History:  Procedure Laterality Date  . APPENDECTOMY    . COLONOSCOPY WITH ESOPHAGOGASTRODUODENOSCOPY (EGD)    . EYE SURGERY    . UPPER ESOPHAGEAL ENDOSCOPIC ULTRASOUND (EUS) N/A 11/19/2014   Procedure: UPPER ESOPHAGEAL ENDOSCOPIC ULTRASOUND (EUS);  Surgeon: Bearl Mulberry, MD;  Location: [facility] ENDOSCOPY;  Service: Endoscopy;  Laterality: N/A;    Prior to Admission medications   Medication Sig Start Date End Date Taking? Authorizing Provider  aspirin EC 81 MG tablet Take 81 mg by mouth daily.    [provider]  HYDROcodone-acetaminophen (NORCO/VICODIN) 5-325 MG tablet Take 1 tablet by mouth every 6 (six) hours as needed for moderate pain. 11/04/17   Tommi Rumps, PA-C  metoprolol succinate (TOPROL-XL) 50 MG 24 hr tablet Take 50 mg by mouth daily. Take with or immediately following a meal.    [provider]  ofloxacin (OCUFLOX) 0.3 % ophthalmic solution 1 drop 4 (four) times  daily.    [provider]  pantoprazole (PROTONIX) 40 MG tablet Take 40 mg by mouth daily.    [provider]  prednisoLONE acetate (PRED FORTE) 1 % ophthalmic suspension 1 drop 4 (four) times daily.    [provider]  predniSONE (DELTASONE) 10 MG tablet Take 3 tablets once a day for the next 5 days. 11/04/17   Tommi Rumps, PA-C    Allergies Patient has no known allergies.  No family history on file.  Social History Social History   Tobacco Use  . Smoking status: Former Games developer  . Smokeless tobacco: Never Used  Substance Use Topics  . Alcohol use: No  . Drug use: No    Review of Systems Constitutional: No fever/chills Cardiovascular: Denies chest pain. Respiratory: Denies shortness of breath. Musculoskeletal: Positive for left knee pain. Skin: Negative for rash. Neurological: Negative for headaches, focal weakness or numbness. ___________________________________________   PHYSICAL EXAM:  VITAL SIGNS: ED Triage Vitals [11/04/17 0845]  Enc Vitals Group     BP (!) 159/82     Pulse Rate 78     Resp 16     Temp 97.8 F (36.6 C)     Temp Source Oral     SpO2 97 %     Weight      Height      Head Circumference      Peak Flow      Pain Score  Pain Loc      Pain Edu?      Excl. in GC?    Constitutional: Alert and oriented. Well appearing and in no acute distress. Eyes: Conjunctivae are normal.  Head: Atraumatic. Neck: No stridor.   Cardiovascular: Normal rate, regular rhythm. Grossly normal heart sounds.  Good peripheral circulation. Respiratory: Normal respiratory effort.  No retractions. Lungs CTAB. Musculoskeletal: On exam bilateral knees appear to be degenerative in nature.  There is minimal crepitus with range of motion of the left knee.  No erythema or warmth is noted.  Soft tissue swelling is present but no effusion is appreciated. Neurologic:  Normal speech and language. No gross focal neurologic deficits are appreciated.  No gait instability. Skin:  Skin is warm, dry and intact. No rash noted. Psychiatric: Mood and affect are normal. Speech and behavior are normal.  ____________________________________________   LABS (all labs ordered are listed, but only abnormal results are displayed)  Labs Reviewed - No data to display  RADIOLOGY  ED MD interpretation:   Left knee x-ray no bony abnormality.  There is questionable effusion with soft tissue edema.  Official radiology report(s): Dg Knee Complete 4 Views Left  Result Date: 11/04/2017 CLINICAL DATA:  Anterior knee pain after being in crawl space 3 days ago. EXAM: LEFT KNEE - COMPLETE 4+ VIEW COMPARISON:  None. FINDINGS: The mineralization and alignment are normal. There is no evidence of acute fracture or dislocation. There is mild patellofemoral joint space narrowing. The additional joint spaces are preserved. There is evidence of a large knee joint effusion with edema in the anterior subcutaneous and infrapatellar fat. No evidence of foreign body or soft tissue emphysema. There is mild femoral atherosclerosis. IMPRESSION: Joint effusion with edema in the anterior soft tissues suggesting inflammation or bursitis. No acute osseous findings. Electronically Signed   By: Carey BullocksWilliam  Veazey M.D.   On: 11/04/2017 09:35    ____________________________________________   PROCEDURES  Procedure(s) performed: None  Procedures  Critical Care performed: No  ____________________________________________   INITIAL IMPRESSION / ASSESSMENT AND PLAN / ED COURSE  As part of my medical decision making, I reviewed the following data within the electronic MEDICAL RECORD NUMBER Notes from prior ED visits and [place] Controlled Substance Database  Patient presents with complaint of left knee pain worse since last evening.  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  Knee is degenerative in appearance and tender anteriorly.  Patient continues to be amatory  without any assistance.  X-rays were suggestive of a bursitis to his left knee with a knee effusion.  Patient was given a prescription for Norco 1 every 6 hours as needed for pain.  He is aware that he cannot drive while taking this medication.  Also prednisone 30 mg daily for the next 5 days.  He is to follow-up with his PCP or orthopedist if any continued problems.  ____________________________________________   FINAL CLINICAL IMPRESSION(S) / ED DIAGNOSES  Final diagnoses:  Bursitis of left knee, unspecified bursa  Effusion of left knee     ED Discharge Orders         Ordered    predniSONE (DELTASONE) 10 MG tablet     11/04/17 0952    HYDROcodone-acetaminophen (NORCO/VICODIN) 5-325 MG tablet  Every 6 hours PRN     11/04/17 [id number]           Note:  This document was prepared using Dragon voice recognition software and may include unintentional dictation errors.  Tommi Rumps, PA-C 11/04/17 1206    Sharyn Creamer, MD 11/04/17 [phone]

## 2017-11-04 NOTE — ED Notes (Signed)
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

## 2017-11-20 MED ORDER — carbidopa-levodopa (SINEMET) 25-100 mg per tablet
25-100 | ORAL_TABLET | Freq: Three times a day (TID) | ORAL | 3 refills | Status: AC
Start: 2017-11-20 — End: 2017-12-20

## 2017-11-20 NOTE — Unmapped (Signed)
Pt requesting prescription refill.       MEDICATION / DOSE / FREQ:    1) Sinemet 25/100mg  3 tabs tid   2)   3)     PHARMACY:  [facility]         PHONE NUMBER: [phone]    DATE OF LAST APPT: 06/14/17    DATE OF NEXT APPT: 12/20/17    Pt states he is out of medication and would like a 30 day supply.

## 2017-11-27 ENCOUNTER — Ambulatory Visit: Payer: MEDICARE

## 2017-12-20 ENCOUNTER — Ambulatory Visit: Admit: 2017-12-20 | Payer: MEDICARE | Attending: Neurology

## 2017-12-20 DIAGNOSIS — G20 Parkinson's disease: Secondary | ICD-10-CM

## 2017-12-20 MED ORDER — carbidopa-levodopa (SINEMET) 25-100 mg per tablet
25-100 | ORAL_TABLET | Freq: Four times a day (QID) | ORAL | 3 refills | Status: AC
Start: 2017-12-20 — End: 2019-01-23

## 2017-12-20 NOTE — Unmapped (Signed)
Subjective:      Patient ID: William Russell is a 82 y.o. male.    Movement Disorders HPI       Handedness: Right  Chief Complaint: Parkinson's  Patient accompanied by and additional history obtained from: wife  Age at onset of symptoms: 57  Duration of symptoms: 3 years      The patient presents for follow-up of PD. He is taking Sinemet 3 tablets at 8am, 2pm, 8pm. He is having wearing off about an hour before his medication is due. He has constipation and is taking prune juice for this. He denies any medication side effects. He is doing rock steady boxing 3 days a week. He has occasional lightheadedness but no fainting. He has been having more anxiety surrounding more cognitively difficult tasks but this may be happening more when medication is wearing off.     Initial evaluation:  The patient presents for an initial visit for tremor and gait difficulties with his wife and daughter. His wife noticed a tremor in his cheek a couple of years ago when he is eating or talking. He noticed a left leg tremor in 05/2016 at rest. ??After this he noticed his balance started to worsen and he started taking shorter steps. [place]. Turns became more difficult. He developed a tremor in right right>??left hand a few months later. The hand tremor is mostly with action. He has difficulty with fine motor movements of his right hand. His handwriting is smaller. His wife feels his smell may be less sensitive. He is having more drooling. He has had dream enactment behaviors for years. He reports mild short term memory issues. He can still do his ADLS and drives without difficulty. He has developed urinary urgency and incontinence since the summer.   ??  He has had more depression and anxiety since his wife was diagnosed with breast cancer in April of 2018.       PD Quality Metrics  Falls (Review Each Visit): present  fell 3 weeks ago  Motor Complications (Review Each Visit): present     Depression (Review Annually):  present     Apathy (Review Annually): present     Anxiety (Review Annually): present     Cognitive Impairment (Review Annually): present  short term memory  Orthostatic Hypotension (Review Annually): denies     Insomnia/Sleep Disturbance (Review Annually): denies     PD Dx Review (Review Annually): yes     PD Med and Surg Option (Review Annually): yes     PD Rehab Options (Review Annually): yes     PD Safety Counseling (Review Annually): yes       Other Symptoms  Gait Freezing: denies     Excessive Sweating: denies     Urinary Incontinence: present  urgency        Dysphagia: denies     Dream Enactment Behavior: present     Anosmia: present     Constipation: present     Hypophonia: present     Hypomimia: present     Micrographia: present        Med Side Effects  Nausea: denies     Vomiting: denies     Lightheadedness: present     Fainting: denies     Daytime Sedation: denies     Dyskinesia: denies     Hallucinations: denies     Delusions: denies     Paranoia: denies     Impulse Control Disorder: denies  Motor Fluctuations        Wearing Off Prior to Next Dose: if late with the next dose.     Imaging  denies       Histories:     He has a past medical history of Depression, Hypertension, and OSA (obstructive sleep apnea).    He has a past surgical history that includes Shoulder surgery; Lumbar spine surgery; Cholecystectomy; and Carpal tunnel release.    His family history includes Cancer in his mother; Coronary artery disease in his mother; Other in his father.    He reports that he has never smoked. He has never used smokeless tobacco. He reports that he does not drink alcohol or use drugs.    Review of Systems  Answers for HPI/ROS submitted by the patient on 12/17/2017   Activity Change: Yes  Appetite change: No  Chills: Yes  Diaphoresis (Excessive Sweating): No  Fatigue: Yes  Neck Pain: Yes  Neck Stiffness: Yes  Hearing Loss: Yes  Tinnitus (ringing in ears): Yes  Congestion: Yes  Postnasal Drip: Yes  Sinus  Pressure: Yes  Eye Discharge: No  Eye Itching: No  Eye Pain: No  Eye Redness: No  Photophobia (Sensitivity to Light): No  Visual Disturbance: No  Apnea: Yes  Chest Tightness: No  Choking: No  Cough: Yes  Shortness of Breath: No  Stridor (High pitched wheezing): Yes  Wheezing: Yes  Chest Pain: No  Leg Swelling: No  Palpitations (Abnormal heartbeat): No  Abominal distention (Abdominal Swelling): No  Abdominal Pain: No  Anal Bleeding: No  Blood in Stool: No  Constipation: Yes  Diarrhea: No  Nausea: No  Rectal Pain: No  Vomiting: No  Cold Intolerance: No  Heat Intolerance: No  Polydipsia (excessive thirst): Yes  Polyphagia (increased appetite): No  Polyuria (frequent urination): Yes  Difficulty urinating: No  Dysuria (Pain with Urination): No  Enuresis (Urinary incontinence): Yes  Flank pain (Pain on one side between upper abdomen and back): No  Frequency (urine): Yes  Genital sore: No  Hematuria (blood in urine): No  Urgency (urine): Yes  Urine decreased: No  Arthralgias (Joint pain): Yes  Back pain: Yes  Gait problem (Problems walking): No  Joint swelling: No  Myalgias (Muscle Pain): No  Color change: No  Pallor (Pale skin): No  Rash : No  Wound: No  Environmental allergies: Yes  Food Allergies : No  Immunocompromised: No  Dizziness: Yes  Facial asymmetry: No  Headaches : No  Light-headedness: Yes  Numbness: No  Seizures: No  Speech difficulty: No  Syncope (fainting): No  Tremors: Yes  Weakness: No  Adenopathy (swollen lymph): No  Bruises/bleeds easily: No  Agitation: No  Behavior problem: No  Confusion: No  Decreased concentration: Yes  Dysphoric mood (unpleasant mood): No  Hallucinations: No  Hyperactive: No  Nervous/anxious: No  Self-injury: No  Sleep disturbance: No  Suicidal ideas: No      Allergies:   Patient has no known allergies.    Medications:     Outpatient Encounter Medications as of 12/20/2017   Medication Sig Dispense Refill   ??? allopurinol (ZYLOPRIM) 300 MG tablet Take 300 mg by mouth daily.     ???  aspirin 325 MG tablet Take 325 mg by mouth daily.     ??? atorvastatin (LIPITOR) 20 MG tablet TAKE 1 TABLET BY MOUTH ONE TIME A DAY AT BEDTIME  3   ??? buPROPion XL (WELLBUTRIN XL) 150 MG 24 hr tablet      ???  calcium citrate-vitamin D (CITRACAL+D) 315-200 mg-unit Take 1 tablet by mouth daily.     ??? carbidopa-levodopa (SINEMET) 25-100 mg per tablet Take 3 tablets by mouth 3 times a day. 810 tablet 3   ??? finasteride (PROSCAR) 5 mg tablet Take 1 tablet (5 mg total) by mouth daily. Indications: benign prostatic hyperplasia with lower urinary tract sx 60 tablet 0   ??? fluticasone propionate (FLONASE) 50 mcg/actuation nasal spray Use 1 spray into each nostril at bedtime.     ??? folic acid (FOLVITE) 400 MCG tablet Take 400 mcg by mouth daily.     ??? glucosamine-chondroitin 500-400 mg tablet Take 1 tablet by mouth daily.     ??? loratadine (CLARITIN) 10 mg tablet Take 10 mg by mouth daily as needed for Allergies.     ??? metoprolol succinate (TOPROL-XL) 25 MG 24 hr tablet Take 25 mg by mouth daily.     ??? montelukast (SINGULAIR) 10 mg tablet      ??? multivit-min/FA/lycopen/lutein (CENTRUM SILVER MEN ORAL) Take by mouth.     ??? niacin 100 MG tablet Take 200 mg by mouth 2 times a day with meals.            ??? omega-3 fatty acids-fish oil 300-1,000 mg capsule Take 1 capsule by mouth daily.     ??? omeprazole (PRILOSEC) 20 MG capsule      ??? PARoxetine (PAXIL) 20 MG tablet Take 20 mg by mouth every morning.     ??? polyethylene glycol (GLYCOLAX) 17 gram/dose powder Take 17 g by mouth daily. 255 g 0   ??? saw palmetto 500 MG capsule Take 500 mg by mouth daily.     ??? tamsulosin (FLOMAX) 0.4 mg Cap Take 1 capsule (0.4 mg total) by mouth at bedtime. Indications: benign prostatic hyperplasia with lower urinary tract sx 60 capsule 0   ??? trospium (SANCTURA XR) 60 mg Cp24 Take 1 capsule (60 mg total) by mouth daily. 60 capsule 0     No facility-administered encounter medications on file as of 12/20/2017.        Objective:         Vitals:    12/20/17 1457    BP: 120/70   BP Location: Left arm   Patient Position: Sitting   BP Cuff Size: Regular   Pulse: 80   SpO2: 90%   Weight: 200 lb (90.7 kg)   Height: 5' 8 (1.727 m)       MDS UPDRS part 3 - Motor Examination  MDS UPDRS III Motor Examination  Date medication last taken: 12/20/17  Time medication last taken : 1400  Medication taken : Sinemet  Medication : ON  General  3.1 Speech: 1  3.2 Facial Expression: 1  Rigidity  3.3a Rigidity- Neck: 1  3.3b Rigidity right upper: 1  3.3c Rigidity left upper: 0  3.3d Rigidity right lower: 0  3.3e Rigidity left lower: 0  Coordination   3.4a Finger tapping, right : 2  3.4b Finger tapping, left: 3  3.5a Hand movement, right: 2  3.5b Hand movement, left: 2  3.6a Pronation-supination of right hand: 1  3.6b Pronation-supination of left hand: 2  3.7a Toe tapping, right : 1  3.7b Toe Tapping, left: 2  3.8a Leg agility, right: 1  3.8b Leg agility, left: 2  Posture and Gait  3.9 Arising from chair : 0  3.10 Gait: 1  3.11 Freezing of gait: 0  3.12 Postural Stability: 1  3.13 Posture: 1  3.14 Body bradykinesia:  1  Tremor  3.15a Postural tremor, right hand: 0  3.15b Postural Tremor, left hand: 1  3.16a Kinetic Tremor of right hand: 0  3.16b Kinetic tremor of left hand: 0  3.17e Lip/jaw rest tremor: 0  3.17a RUE Rest Tremor Amplitude: 0  3.17b LUE Rest Tremor Amplitude: 0  3.17c RLE Rest Tremor Amplitude: 0  3.17d LLE Rest Tremor Amplitude: 0  3.17e Constancy of Rest tremor: 0     MDS UPDRS 3 Total: 27          Assessment:     1. 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 He is having more wearing off manifested as return of tremor  and possibly wearing off anxiety.  He has urinary urgency that is bothersome and is seeing Dr. Ree Shay for this. He has short-term memory concerns. His MOCA is 22/30. He has constipation that has improved on Miralax.     Plan:     1. Change Sinemet to 3 tablets 4 times daily (8am, 12pm, 4pm and 8pm) to help with wearing off. If anxiety does not improve, consider increasing paxil in the future.   2. Continue exercise as he is doing.   3.  Follow-up with Louanna Raw in 4 months.

## 2017-12-21 MED ORDER — finasteride (PROSCAR) 5 mg tablet
5 | ORAL_TABLET | ORAL | 3 refills | Status: AC
Start: 2017-12-21 — End: 2017-12-24

## 2017-12-21 MED ORDER — tamsulosin (FLOMAX) 0.4 mg Cap
0.4 | ORAL_CAPSULE | ORAL | 3 refills | Status: AC
Start: 2017-12-21 — End: 2017-12-24

## 2017-12-24 ENCOUNTER — Ambulatory Visit: Admit: 2017-12-24 | Payer: MEDICARE | Attending: Family

## 2017-12-24 DIAGNOSIS — R3915 Urgency of urination: Secondary | ICD-10-CM

## 2017-12-24 MED ORDER — finasteride (PROSCAR) 5 mg tablet
5 | ORAL_TABLET | Freq: Every day | ORAL | 3 refills | Status: AC
Start: 2017-12-24 — End: 2018-01-23

## 2017-12-24 MED ORDER — trospium (SANCTURA XR) 60 mg Cp24
60 | ORAL_CAPSULE | Freq: Every day | ORAL | 0 refills | Status: AC
Start: 2017-12-24 — End: 2018-01-09

## 2017-12-24 MED ORDER — tamsulosin (FLOMAX) 0.4 mg Cap
0.4 | ORAL_CAPSULE | Freq: Every evening | ORAL | 3 refills | Status: AC
Start: 2017-12-24 — End: 2018-01-23

## 2017-12-24 NOTE — Unmapped (Signed)
No chief complaint on file.       History of Present Illness  81y/o male with history of PD (03/2017), OSA, constipation, depression. Here for follow up      BPH with LUTS  Started on trospium, flomax, and proscar last visit   Pharmacy didn't notify that tropsium was ready, never started medication  Urinary leakage better and feels he is emptying his bladder better  Brought VD today   Reports urinary urgency and UUI for the last several months.   Frequency 3-4x daily. Nocturia 1x.   Small volume urinary leakage 4-5x daily. Uses pads 60% of days. Changes approx once daily. Always change pad before going to bed.   Denies dysuria, abdominal pain, hesitancy. Strong urine stream.     Reports constipation due to carvidopa.     Taking CBD for joints, effective          Review of Systems   Constitutional: Negative for chills, diaphoresis, fatigue and fever.   Respiratory: Negative for cough, chest tightness, shortness of breath and wheezing.    Cardiovascular: Negative for chest pain, palpitations and leg swelling.   Gastrointestinal: Positive for constipation. Negative for abdominal distention, abdominal pain, diarrhea, heartburn and nausea.   Genitourinary: Positive for frequency, nocturia, scrotal swelling (Patient feels like right testicle is growing in size) and urgency. Negative for dysuria, flank pain, hematuria and testicular pain.   Musculoskeletal: Positive for arthralgias. Negative for back pain, gait problem, joint swelling and myalgias.   Neurological: Negative for dizziness.   Psychiatric/Behavioral: Negative for agitation and confusion.       Allergies  Patient has no known allergies.    Medications  Outpatient Encounter Medications as of 12/24/2017   Medication Sig Dispense Refill   ??? allopurinol (ZYLOPRIM) 300 MG tablet Take 300 mg by mouth daily.     ??? aspirin 325 MG tablet Take 325 mg by mouth daily.     ??? atorvastatin (LIPITOR) 20 MG tablet TAKE 1 TABLET BY MOUTH ONE TIME A DAY AT BEDTIME  3   ???  buPROPion XL (WELLBUTRIN XL) 150 MG 24 hr tablet      ??? calcium citrate-vitamin D (CITRACAL+D) 315-200 mg-unit Take 1 tablet by mouth daily.     ??? carbidopa-levodopa (SINEMET) 25-100 mg per tablet Take 3 tablets by mouth 4 times a day. 1080 tablet 3   ??? finasteride (PROSCAR) 5 mg tablet TAKE ONE TABLET BY MOUTH DAILY 60 tablet 3   ??? fluticasone propionate (FLONASE) 50 mcg/actuation nasal spray Use 1 spray into each nostril at bedtime.     ??? folic acid (FOLVITE) 400 MCG tablet Take 400 mcg by mouth daily.     ??? glucosamine-chondroitin 500-400 mg tablet Take 1 tablet by mouth daily.     ??? loratadine (CLARITIN) 10 mg tablet Take 10 mg by mouth daily as needed for Allergies.     ??? metoprolol succinate (TOPROL-XL) 25 MG 24 hr tablet Take 25 mg by mouth daily.     ??? montelukast (SINGULAIR) 10 mg tablet      ??? multivit-min/FA/lycopen/lutein (CENTRUM SILVER MEN ORAL) Take by mouth.     ??? niacin 100 MG tablet Take 200 mg by mouth 2 times a day with meals.            ??? omega-3 fatty acids-fish oil 300-1,000 mg capsule Take 1 capsule by mouth daily.     ??? omeprazole (PRILOSEC) 20 MG capsule      ??? PARoxetine (PAXIL) 20 MG tablet  Take 20 mg by mouth every morning.     ??? polyethylene glycol (GLYCOLAX) 17 gram/dose powder Take 17 g by mouth daily. 255 g 0   ??? saw palmetto 500 MG capsule Take 500 mg by mouth daily.     ??? tamsulosin (FLOMAX) 0.4 mg Cap TAKE ONE CAPSULE BY MOUTH AT BEDTIME 60 capsule 3   ??? trospium (SANCTURA XR) 60 mg Cp24 Take 1 capsule (60 mg total) by mouth daily. 60 capsule 0   ??? [DISCONTINUED] carbidopa-levodopa (SINEMET) 25-100 mg per tablet Take 3 tablets by mouth 3 times a day. 810 tablet 3   ??? [DISCONTINUED] finasteride (PROSCAR) 5 mg tablet Take 1 tablet (5 mg total) by mouth daily. Indications: benign prostatic hyperplasia with lower urinary tract sx 60 tablet 0   ??? [DISCONTINUED] tamsulosin (FLOMAX) 0.4 mg Cap Take 1 capsule (0.4 mg total) by mouth at bedtime. Indications: benign prostatic hyperplasia  with lower urinary tract sx 60 capsule 0     No facility-administered encounter medications on file as of 12/24/2017.         Histories  He has a past medical history of Depression, Hypertension, and OSA (obstructive sleep apnea).    He has a past surgical history that includes Shoulder surgery; Lumbar spine surgery; Cholecystectomy; and Carpal tunnel release.    His family history includes Cancer in his mother; Coronary artery disease in his mother; Other in his father.    He reports that he has never smoked. He has never used smokeless tobacco. He reports that he does not drink alcohol or use drugs.    The following portions of the patient's history were reviewed and updated as appropriate: allergies, current medications, past family history, past medical history, past social history, past surgical history and problem list.    There were no vitals taken for this visit.  Physical Exam   Vitals reviewed.  Constitutional: He is oriented to person, place, and time. He appears well-developed and well-nourished.   HENT:   Head: Normocephalic and atraumatic.   Eyes: Pupils are equal, round, and reactive to light.   Neck: Normal range of motion. Neck supple. No tracheal deviation present.   Cardiovascular: Normal rate, regular rhythm and normal heart sounds.   Pulmonary/Chest: Effort normal and breath sounds normal. No respiratory distress. He has no wheezes. He has no rales. He exhibits no tenderness.   Abdominal: Soft. Bowel sounds are normal. He exhibits no distension and no mass. There is no tenderness. There is no rebound and no guarding.   Genitourinary: Uncircumcised.    Genitourinary Comments: Small R hydrocele   Musculoskeletal: Normal range of motion.     Neurological: He is alert and oriented to person, place, and time.   Skin: Skin is warm and dry. No rash noted. No erythema.   Psychiatric: He has a normal mood and affect. His behavior is normal.           Assessment  81y/o male with history of PD (03/2017),  OSA, constipation, depression referred for UUI.       BPH with LUTS  Started on trospium, flomax, and proscar last visit   Pharmacy didn't notify that tropsium was ready, never started medication  Urinary leakage better and feels he is emptying his bladder better  Brought VD today   Reports urinary urgency and UUI for the last several months.   Frequency 3-4x daily. Nocturia 1x.   Small volume urinary leakage 4-5x daily. Uses pads 60% of days. Changes approx  once daily. Always change pad before going to bed.   Denies dysuria, abdominal pain, hesitancy. Strong urine stream.   Feels like he does not fully empty his bladder with urination, and has to bear down to completely empty at the end of urination.     Reports constipation due to carvidopa.     Taking CBD for joints, effective         VD:   Daytime Frequency: 5  Nocturia: 3  Maximum volume/void: 24 oz  Minimum volume/void: 8 oz  Average volume/void: 15oz  Total voided volume/day:73 oz  Total volume voided/night:39 oz  Total fluid intake/24 hours: 103 oz   Number of UUI episodes:1   Number of SUI episodes:0  Number of pads changed:1    Previous DRE: Moderately enlarged prostate.   Previous PVR: 16      PVR today: 53 ml    Plan  1.) Scrotal US ordered to assess increasing right testicular swelling   2.) Trospium resent to pharmacy   3.) Refilled Proscar and Tamsulosin   4.) PM fluid restrictions.   5.) RTC 4-6 weeks. If he is doing well, we will continue current management. If not, will plan for cystoscopy to further evaluate bladder and prostate.        Medical Decision Making  The following items were considered in medical decision making:  Review / order clinical lab tests  Reviewed outside records

## 2017-12-25 ENCOUNTER — Ambulatory Visit: Payer: MEDICARE

## 2017-12-25 MED ORDER — mirabegron (MYRBETRIQ) 25 mg Tb24
25 | ORAL_TABLET | Freq: Every day | ORAL | 3 refills | Status: AC
Start: 2017-12-25 — End: 2018-01-09

## 2017-12-27 NOTE — Telephone Encounter (Signed)
Pt calling as he went to pick up Myrbertiq only to discover that it cost over $270. Pt is asking for a less costly alternative, or something covered by his ins.

## 2017-12-28 ENCOUNTER — Inpatient Hospital Stay: Admit: 2017-12-28 | Discharge: 2018-01-01 | Payer: MEDICARE | Attending: Family

## 2017-12-28 DIAGNOSIS — N503 Cyst of epididymis: Secondary | ICD-10-CM

## 2017-12-28 NOTE — Telephone Encounter (Signed)
1:17pm on 12/28/17 LM for pt with list of medications for pt to check with insurance on coverage and for pt to return office call

## 2018-01-01 NOTE — Telephone Encounter (Signed)
Pt calling as he was not able to retrieve the message listing possible alternatives to the medication that cost $217. Please call pt with names of meds fo rpt to see which one his ins coveres.

## 2018-01-01 NOTE — Telephone Encounter (Signed)
2:16pm on 01/01/18 gave pt list of medication to see what his insurance will cover.  Once pt receive word from insurance he will call office and inform us of what we can call in for him

## 2018-01-02 NOTE — Unmapped (Signed)
1:57pm on 01/02/18 Pt informed his scrotal US showed. NO intervention is needed at this time. We will monitor, scrotal swelling. Right now cysts are small and not causing any issues. ?? pt had no questions and will call the office if any pain or swelling occurs

## 2018-01-02 NOTE — Telephone Encounter (Signed)
Pt calling Amy back as he has a few questions.

## 2018-01-02 NOTE — Telephone Encounter (Signed)
Pt states that insurance company stated that Oxybutynin and Myrbetriq is formulated medicatons and to prescribe one and we would be able to present a PA if needed to get medication approved. Will forward message to [name] and contact pt once I know which medication Leotis Shames is going to prescribe.

## 2018-01-02 NOTE — Telephone Encounter (Signed)
Pt calling to request a call from MD ofc. He has info from ins co PP:IRJJOAC medications. He reports that the ins co informed him that all 4 alternate meds proposed by MD are all non-formulary. Ins co provided names of 2 other meds that pt must try first. Please advise.

## 2018-01-03 MED ORDER — mirabegron (MYRBETRIQ) 25 mg Tb24
25 | ORAL_TABLET | Freq: Every day | ORAL | 1 refills | Status: AC
Start: 2018-01-03 — End: 2018-01-09

## 2018-01-03 NOTE — Unmapped (Signed)
Addended by: Saintclair Halsted, Kendrix Orman L on: 01/03/2018 11:02 AM     Modules accepted: Orders

## 2018-01-07 NOTE — Telephone Encounter (Signed)
Pt calling to report that the other medications proposed as an alternative meds are actually more expensive than the first. Pt is request a return call.

## 2018-01-09 MED ORDER — oxybutynin (DITROPAN-XL) 5 MG 24 hr tablet
5 | ORAL_TABLET | Freq: Two times a day (BID) | ORAL | 3 refills | Status: AC
Start: 2018-01-09 — End: 2018-02-25

## 2018-01-09 NOTE — Telephone Encounter (Signed)
Pt calling as he has been prescribed trospium and Myrbetriq. Pt reports that the trospium is over $400 and Myrbetriq is over $200. He reports that he was given 4 alternative meds by [employer], but pt does not remember what they are. He is asking for a call back from office.

## 2018-01-09 NOTE — Telephone Encounter (Signed)
1:10pm on 01/09/18 William Russell has spoken with pt and has taken care of the issue .

## 2018-01-09 NOTE — Telephone Encounter (Signed)
4:43pm on 01/09/18 PA started for pt for Oxybutynin 5mg .  Pt has been notified that once we receive a response we will notify him.

## 2018-01-09 NOTE — Progress Notes (Signed)
Patient states that none of the anticholinergics are covered by his insurance. Will call pharmacy to see what is covered since patient is having trouble. Sent script for ditropan 5mg  BID. Patient should call to see when it will be ready and if it is covered.

## 2018-01-16 NOTE — Telephone Encounter (Signed)
Please call patient in regards to an approval for Myrbetriq.

## 2018-01-16 NOTE — Telephone Encounter (Signed)
12:59pm on 01/16/18 pt informed that PA for Myrbetriq wasnapproved on 01/09/18.  Pt is going to contact pharmacy

## 2018-02-18 ENCOUNTER — Other Ambulatory Visit: Payer: Self-pay | Admitting: Gastroenterology

## 2018-02-18 DIAGNOSIS — K8689 Other specified diseases of pancreas: Secondary | ICD-10-CM

## 2018-02-21 NOTE — Unmapped (Signed)
Pt calling to request a call from MD ofc to review medications with him. Pt reports that he believes that oxybutynin was prescribed in place of Myrbetriq, but is not certain. He is also req to review tamsulosin and finasteride to see if he should still be taking these meds as well.

## 2018-02-21 NOTE — Unmapped (Signed)
4:35pm on 02/21/18 Pt stated that he has a letter from [employer] for approval on [employer]. Pt would like to discuss staying on Flomax at this time with [name] when he comes in for his follow up on 02/25/18.  He feels the medications he is on now is helping with his symptoms.  Pt had no further questions at time of the call

## 2018-02-25 ENCOUNTER — Ambulatory Visit: Admit: 2018-02-25 | Discharge: 2018-03-01 | Payer: MEDICARE | Attending: Family

## 2018-02-25 DIAGNOSIS — N400 Enlarged prostate without lower urinary tract symptoms: Secondary | ICD-10-CM

## 2018-02-25 LAB — POC URINALYSIS
Bilirubin, UA: NEGATIVE mg/dL
Blood, POC, UA: NEGATIVE
Glucose, POC, UA: NEGATIVE mg/dL
Ketones, POC, UA: NEGATIVE mg/dL
Leukocytes, POC, UA: NEGATIVE
Nitrite, POC, UA: NEGATIVE
Protein, POC, UA: NEGATIVE mg/dL
Spec Grav, UA: 1.015 (ref 1.005–1.035)
Urobilinogen, POC, UA: 0.2 EU/dL (ref 0.2–1.0)
pH, POC,UA: 7 (ref 5.0–8.0)

## 2018-02-25 MED ORDER — mirabegron (MYRBETRIQ) 25 mg Tb24
25 | ORAL_TABLET | Freq: Every day | ORAL | 5 refills | Status: AC
Start: 2018-02-25 — End: 2018-03-27

## 2018-02-25 NOTE — Unmapped (Signed)
Chief Complaint   Patient presents with   ??? 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 Here for follow up      BPH with LUTS  -On myrbetriq 25 mg once a day  -Frequency- much less,  able to go a few hours   -Nocturia- can sleep for 6 hours now, up x1  -Urgency still present  -Urinary leakage better and feels he is emptying his bladder better  -Taking Flomax and Proscar  -With concern about left testicular swelling   -Denies dysuria, flank pain, fever, chills, urgency    Scrotal US:   IMPRESSION: (12/28/17)??  1.  Multiple right epididymal cysts, the largest measuring up to 2.3 cm.  2.  Right varicocele.       Review of Systems   Constitutional: Negative for chills, diaphoresis, fatigue and fever.   Respiratory: Negative for cough, chest tightness, shortness of breath and wheezing.    Cardiovascular: Negative for chest pain, palpitations and leg swelling.   Gastrointestinal: Positive for constipation. Negative for abdominal distention, abdominal pain, diarrhea, heartburn and nausea.   Genitourinary: Positive for nocturia, scrotal swelling (Patient feels like right testicle is growing in size) and urgency. Negative for dysuria, flank pain, frequency, hematuria and testicular pain.   Musculoskeletal: Positive for arthralgias. Negative for back pain, gait problem, joint swelling and myalgias.   Neurological: Negative for dizziness.   Psychiatric/Behavioral: Negative for agitation and confusion.       Allergies  Patient has no known allergies.    Medications  Outpatient Encounter Medications as of 02/25/2018   Medication Sig Dispense Refill   ??? allopurinol (ZYLOPRIM) 300 MG tablet Take 300 mg by mouth daily.     ??? aspirin 325 MG tablet Take 325 mg by mouth daily.     ??? atorvastatin (LIPITOR) 20 MG tablet TAKE 1 TABLET BY MOUTH ONE TIME A DAY AT BEDTIME  3   ??? buPROPion XL (WELLBUTRIN XL) 150 MG 24 hr tablet      ???  calcium citrate-vitamin D (CITRACAL+D) 315-200 mg-unit Take 1 tablet by mouth daily.     ??? carbidopa-levodopa (SINEMET) 25-100 mg per tablet Take 3 tablets by mouth 4 times a day. 1080 tablet 3   ??? finasteride (PROSCAR) 5 mg tablet      ??? fluticasone propionate (FLONASE) 50 mcg/actuation nasal spray Use 1 spray into each nostril at bedtime.     ??? folic acid (FOLVITE) 400 MCG tablet Take 400 mcg by mouth daily.     ??? glucosamine-chondroitin 500-400 mg tablet Take 1 tablet by mouth daily.     ??? loratadine (CLARITIN) 10 mg tablet Take 10 mg by mouth daily as needed for Allergies.     ??? metoprolol succinate (TOPROL-XL) 25 MG 24 hr tablet Take 25 mg by mouth daily.     ??? montelukast (SINGULAIR) 10 mg tablet      ??? multivit-min/FA/lycopen/lutein (CENTRUM SILVER MEN ORAL) Take by mouth.     ??? mupirocin (BACTROBAN) 2 % ointment      ??? MYRBETRIQ 25 mg Tb24      ??? niacin 100 MG tablet Take 200 mg by mouth 2 times a day with meals.            ??? omega-3 fatty acids-fish oil 300-1,000 mg capsule Take 1 capsule by mouth daily.     ??? omeprazole (PRILOSEC) 20  MG capsule      ??? oxybutynin (DITROPAN-XL) 5 MG 24 hr tablet Take 1 tablet (5 mg total) by mouth 2 times a day. 30 tablet 3   ??? PARoxetine (PAXIL) 20 MG tablet Take 20 mg by mouth every morning.     ??? paroxetine (PAXIL) 30 MG tablet      ??? polyethylene glycol (GLYCOLAX) 17 gram/dose powder Take 17 g by mouth daily. 255 g 0   ??? saw palmetto 500 MG capsule Take 500 mg by mouth daily.       No facility-administered encounter medications on file as of 02/25/2018.         Histories  He has a past medical history of Depression, Hypertension, and OSA (obstructive sleep apnea).    He has a past surgical history that includes Shoulder surgery; Lumbar spine surgery; Cholecystectomy; and Carpal tunnel release.    His family history includes Cancer in his mother; Coronary artery disease in his mother; Other in his father.    He reports that he has never smoked. He has never used smokeless  tobacco. He reports that he does not drink alcohol or use drugs.    The following portions of the patient's history were reviewed and updated as appropriate: allergies, current medications, past family history, past medical history, past social history, past surgical history and problem list.    Blood pressure 120/68, pulse 66, height 5' 8 (1.727 m), weight 195 lb (88.5 kg), SpO2 97 %.  Physical Exam   Vitals reviewed.  Constitutional: He is oriented to person, place, and time. He appears well-developed and well-nourished.   HENT:   Head: Normocephalic and atraumatic.   Eyes: Pupils are equal, round, and reactive to light.   Neck: Normal range of motion. Neck supple. No tracheal deviation present.   Cardiovascular: Normal rate, regular rhythm and normal heart sounds.   Pulmonary/Chest: Effort normal and breath sounds normal. No respiratory distress. He has no wheezes. He has no rales. He exhibits no tenderness.   Abdominal: Soft. Bowel sounds are normal. He exhibits no distension and no mass. There is no tenderness. There is no rebound and no guarding.   Genitourinary: Uncircumcised.    Genitourinary Comments: Small R hydrocele   Musculoskeletal: Normal range of motion.     Neurological: He is alert and oriented to person, place, and time.   Skin: Skin is warm and dry. No rash noted. No erythema.   Psychiatric: He has a normal mood and affect. His behavior is normal.           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     BPH with LUTS  -On myrbetriq 25 mg once a day  -Frequency- much less,  able to go a few hours   -Nocturia- can sleep for 6 hours now, up x1  -Urgency still present  -Urinary leakage better and feels he is emptying his bladder better  -Taking Flomax and Proscar   -Denies dysuria, flank pain, fever, chills, urgency      VD:   Daytime Frequency: 5  Nocturia: 3  Maximum volume/void: 24 oz  Minimum volume/void: 8 oz  Average volume/void:  15oz  Total voided volume/day:73 oz  Total volume voided/night:39 oz  Total fluid intake/24 hours: 103 oz   Number of UUI episodes:1   Number of SUI episodes:0  Number of pads changed:1    Previous DRE: Moderately enlarged prostate.   Previous PVR: 16  PVR today: 86 ml  UA: Negative     Plan  1.) Reassurance given concerning spermatoceles will continue to observe. If swelling worsens or becomes painful will consider spermatocele  2.) Continue Myrbetriq, Proscar, and Tamsulosin   4.) PM fluid restrictions.   5.) RTC 3 month. If he is doing well, we will continue current management. If not, will plan for cystoscopy to further evaluate bladder and prostate, assess effectiveness of medications and assess scrotal swelling.     Medical Decision Making  The following items were considered in medical decision making:  Review / order clinical lab tests  Reviewed outside records

## 2018-03-04 ENCOUNTER — Other Ambulatory Visit: Payer: Self-pay | Admitting: Gastroenterology

## 2018-03-04 DIAGNOSIS — K8689 Other specified diseases of pancreas: Secondary | ICD-10-CM

## 2018-03-05 ENCOUNTER — Encounter (INDEPENDENT_AMBULATORY_CARE_PROVIDER_SITE_OTHER): Payer: Self-pay

## 2018-03-05 ENCOUNTER — Other Ambulatory Visit
Admission: RE | Admit: 2018-03-05 | Discharge: 2018-03-05 | Disposition: A | Discharge status: Home / Self Care | Payer: Medicare Other | Source: Home / Self Care | Attending: Gastroenterology | Admitting: Gastroenterology

## 2018-03-05 ENCOUNTER — Ambulatory Visit
Admission: RE | Admit: 2018-03-05 | Discharge: 2018-03-05 | Disposition: A | Discharge status: Home / Self Care | Payer: Medicare Other | Source: Ambulatory Visit | Attending: Gastroenterology | Admitting: Gastroenterology

## 2018-03-05 DIAGNOSIS — K8689 Other specified diseases of pancreas: Secondary | ICD-10-CM | POA: Diagnosis present

## 2018-03-05 LAB — CREATININE, SERUM: CREATININE: 1.05 mg/dL (ref 0.61–1.24)

## 2018-03-05 MED ORDER — GADOBUTROL 1 MMOL/ML IV SOLN
7.0000 mL | Freq: Once | INTRAVENOUS | Status: AC | PRN
  Administered 2018-03-05: 7 mL via INTRAVENOUS

## 2018-03-08 MED ORDER — oxybutynin (DITROPAN) 5 MG tablet
5 | ORAL_TABLET | Freq: Three times a day (TID) | ORAL | 0 refills | Status: AC
Start: 2018-03-08 — End: 2018-05-24

## 2018-03-25 MED ORDER — polyethylene glycol (GLYCOLAX) 17 gram/dose powder
17 | Freq: Every day | ORAL | 11 refills | 14.00000 days | Status: AC
Start: 2018-03-25 — End: ?

## 2018-04-05 ENCOUNTER — Ambulatory Visit: Admit: 2018-04-05 | Payer: Medicare Other | Admitting: Gastroenterology

## 2018-04-05 SURGERY — COLONOSCOPY WITH PROPOFOL
Anesthesia: General

## 2018-04-25 ENCOUNTER — Ambulatory Visit: Payer: MEDICARE | Attending: Adult Health

## 2018-04-25 NOTE — Unmapped (Deleted)
Subjective:      Patient ID: William Russell is a 83 y.o. male.    Movement Disorders HPI                     years    The patient presents for follow-up of PD. He saw Dr. Gerilyn Pilgrim in November and at that visit we changed Sinemet to 3 tablets 4 times daily (8am, 12pm, 4pm and 8pm) to help with wearing off.   He is taking Sinemet 3 tablets at 8am        He is having wearing off about an hour before his medication is due. He has constipation and is taking prune juice for this. He denies any medication side effects. He is doing rock steady boxing 3 days a week. He has occasional lightheadedness but no fainting. He has been having more anxiety surrounding more cognitively difficult tasks but this may be happening more when medication is wearing off.   ??  Initial evaluation:????The patient presents for an initial visit for tremor and gait difficulties with his wife and daughter. His wife noticed a tremor in his cheek a couple of years ago when he is eating or talking. He noticed a left leg tremor in 05/2016 at rest. ??After this he noticed his balance started to worsen and he started taking shorter steps. [place] Turns became more difficult. He developed a tremor in right right>??left hand a few months later. The hand tremor is mostly with action. He has difficulty with fine motor movements of his right hand. His handwriting is smaller. His wife feels his smell may be less sensitive. He is having more drooling. He has had dream enactment behaviors for years. He reports mild short term memory issues. He can still do his ADLS and drives without difficulty. He has developed urinary urgency and incontinence since the summer.     PD Quality Metrics                                                                            Other Symptoms                                                                       Med Side Effects                                                                Motor Fluctuations              Imaging          Histories:     He has a past medical history of Depression, Hypertension, and OSA (obstructive sleep apnea).    He has a past surgical  history that includes Shoulder surgery; Lumbar spine surgery; Cholecystectomy; and Carpal tunnel release.    His family history includes Cancer in his mother; Coronary artery disease in his mother; Other in his father.    He reports that he has never smoked. He has never used smokeless tobacco. He reports that he does not drink alcohol or use drugs.    Review of Systems  Refer to HPI for review of systems documentation.  ROS was otherwise non-contributory.     Allergies:   Patient has no known allergies.    Medications:     Outpatient Encounter Medications as of 04/25/2018   Medication Sig Dispense Refill   ??? allopurinol (ZYLOPRIM) 300 MG tablet Take 300 mg by mouth daily.     ??? aspirin 325 MG tablet Take 325 mg by mouth daily.     ??? atorvastatin (LIPITOR) 20 MG tablet TAKE 1 TABLET BY MOUTH ONE TIME A DAY AT BEDTIME  3   ??? buPROPion XL (WELLBUTRIN XL) 150 MG 24 hr tablet      ??? calcium citrate-vitamin D (CITRACAL+D) 315-200 mg-unit Take 1 tablet by mouth daily.     ??? carbidopa-levodopa (SINEMET) 25-100 mg per tablet Take 3 tablets by mouth 4 times a day. 1080 tablet 3   ??? finasteride (PROSCAR) 5 mg tablet      ??? fluticasone propionate (FLONASE) 50 mcg/actuation nasal spray Use 1 spray into each nostril at bedtime.     ??? folic acid (FOLVITE) 400 MCG tablet Take 400 mcg by mouth daily.     ??? glucosamine-chondroitin 500-400 mg tablet Take 1 tablet by mouth daily.     ??? loratadine (CLARITIN) 10 mg tablet Take 10 mg by mouth daily as needed for Allergies.     ??? metoprolol succinate (TOPROL-XL) 25 MG 24 hr tablet Take 25 mg by mouth daily.     ??? montelukast (SINGULAIR) 10 mg tablet      ??? multivit-min/FA/lycopen/lutein (CENTRUM SILVER MEN ORAL) Take by mouth.     ??? mupirocin (BACTROBAN) 2 % ointment      ??? niacin 100 MG tablet Take 200 mg by mouth 2 times a day with  meals.            ??? omega-3 fatty acids-fish oil 300-1,000 mg capsule Take 1 capsule by mouth daily.     ??? omeprazole (PRILOSEC) 20 MG capsule      ??? oxybutynin (DITROPAN) 5 MG tablet Take 1 tablet (5 mg total) by mouth 3 times a day. 270 tablet 0   ??? PARoxetine (PAXIL) 20 MG tablet Take 20 mg by mouth every morning.     ??? paroxetine (PAXIL) 30 MG tablet      ??? polyethylene glycol (GLYCOLAX) 17 gram/dose powder Take 17 g by mouth daily. 510 g 11   ??? saw palmetto 500 MG capsule Take 500 mg by mouth daily.       No facility-administered encounter medications on file as of 04/25/2018.        Objective:       There were no vitals filed for this visit.    MDS UPDRS part 3 - Motor Examination                       MDS UPDRS 3            Assessment:     No diagnosis found.    ***    Plan:     ***

## 2018-05-02 ENCOUNTER — Encounter: Payer: MEDICARE | Attending: Adult Health

## 2018-05-21 ENCOUNTER — Encounter: Payer: MEDICARE | Attending: Family

## 2018-05-24 ENCOUNTER — Ambulatory Visit: Admit: 2018-05-24 | Discharge: 2018-06-25 | Payer: MEDICARE | Attending: Family

## 2018-05-24 DIAGNOSIS — R3915 Urgency of urination: Secondary | ICD-10-CM

## 2018-05-24 MED ORDER — trospium (SANCTURA) 20 mg tablet
20 | ORAL_TABLET | Freq: Two times a day (BID) | ORAL | 3 refills | Status: AC
Start: 2018-05-24 — End: 2018-08-01

## 2018-05-24 NOTE — Unmapped (Signed)
Chief Complaint   Patient presents with   ??? Follow-up   ??? Benign Prostatic Hypertrophy        History of Present Illness  83y/o male with history of PD (03/2017), OSA, constipation, depression. Here for follow up     BPH with LUTS  -Daily urgency no leaks   -Frequency every 3-4 hours  -Strong stream  -Since starting Ditropan has had Hallucinations  -Reports seeing birds and insects that aren't there. Had one incident were he woke up thinking a man was at t  the edge of the bed. Feels they have increased since starting ditropan.   -Previously on Myrbetriq but now not approved by insurance   -Nocturia- can sleep for 6 hours now, up x1  -Urinary leakage better and feels he is emptying his bladder better  -Taking Flomax and Proscar  -Left testicular swelling continuing. No pain.    -Denies dysuria, flank pain, fever, chills, urgency    Scrotal US:   IMPRESSION: (12/28/17)??  1.  Multiple right epididymal cysts, the largest measuring up to 2.3 cm.  2.  Right varicocele.       Review of Systems   Constitutional: Negative for chills, diaphoresis, fatigue and fever.   Respiratory: Negative for cough, chest tightness, shortness of breath and wheezing.    Cardiovascular: Negative for chest pain, palpitations and leg swelling.   Gastrointestinal: Positive for constipation. Negative for abdominal distention, abdominal pain, diarrhea, heartburn and nausea.   Genitourinary: Positive for nocturia, scrotal swelling (Patient feels like right testicle is growing in size) and urgency. Negative for dysuria, flank pain, frequency, hematuria and testicular pain.   Musculoskeletal: Positive for arthralgias. Negative for back pain, gait problem, joint swelling and myalgias.   Neurological: Negative for dizziness.   Psychiatric/Behavioral: Negative for agitation and confusion.       Allergies  Patient has no known allergies.    Medications  Outpatient Encounter Medications as of 05/24/2018   Medication Sig Dispense Refill   ??? allopurinol  (ZYLOPRIM) 300 MG tablet Take 300 mg by mouth daily.     ??? aspirin 325 MG tablet Take 325 mg by mouth daily.     ??? atorvastatin (LIPITOR) 20 MG tablet TAKE 1 TABLET BY MOUTH ONE TIME A DAY AT BEDTIME  3   ??? buPROPion XL (WELLBUTRIN XL) 150 MG 24 hr tablet      ??? calcium citrate-vitamin D (CITRACAL+D) 315-200 mg-unit Take 1 tablet by mouth daily.     ??? carbidopa-levodopa (SINEMET) 25-100 mg per tablet Take 3 tablets by mouth 4 times a day. 1080 tablet 3   ??? finasteride (PROSCAR) 5 mg tablet      ??? fluticasone propionate (FLONASE) 50 mcg/actuation nasal spray Use 1 spray into each nostril at bedtime.     ??? folic acid (FOLVITE) 400 MCG tablet Take 400 mcg by mouth daily.     ??? glucosamine-chondroitin 500-400 mg tablet Take 1 tablet by mouth daily.     ??? loratadine (CLARITIN) 10 mg tablet Take 10 mg by mouth daily as needed for Allergies.     ??? metoprolol succinate (TOPROL-XL) 25 MG 24 hr tablet Take 25 mg by mouth daily.     ??? montelukast (SINGULAIR) 10 mg tablet      ??? multivit-min/FA/lycopen/lutein (CENTRUM SILVER MEN ORAL) Take by mouth.     ??? mupirocin (BACTROBAN) 2 % ointment      ??? niacin 100 MG tablet Take 200 mg by mouth 2 times a day with meals.            ???  omega-3 fatty acids-fish oil 300-1,000 mg capsule Take 1 capsule by mouth daily.     ??? omeprazole (PRILOSEC) 20 MG capsule      ??? oxybutynin (DITROPAN) 5 MG tablet Take 1 tablet (5 mg total) by mouth 3 times a day. 270 tablet 0   ??? PARoxetine (PAXIL) 20 MG tablet Take 20 mg by mouth every morning.     ??? paroxetine (PAXIL) 30 MG tablet      ??? polyethylene glycol (GLYCOLAX) 17 gram/dose powder Take 17 g by mouth daily. 510 g 11   ??? saw palmetto 500 MG capsule Take 500 mg by mouth daily.       No facility-administered encounter medications on file as of 05/24/2018.         Histories  He has a past medical history of Depression, Hypertension, and OSA (obstructive sleep apnea).    He has a past surgical history that includes Shoulder surgery; Lumbar spine  surgery; Cholecystectomy; and Carpal tunnel release.    His family history includes Cancer in his mother; Coronary artery disease in his mother; Other in his father.    He reports that he has never smoked. He has never used smokeless tobacco. He reports that he does not drink alcohol or use drugs.    The following portions of the patient's history were reviewed and updated as appropriate: allergies, current medications, past family history, past medical history, past social history, past surgical history and problem list.    There were no vitals taken for this visit.  Physical Exam   Unable to complete d/t telephone visit.         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       BPH with LUTS  -Daily urgency no leaks   -Frequency every 3-4 hours  -Strong stream  -Since starting Ditropan has had Hallucinations  -Reports seeing birds and insects that aren't there. Had one incident were he woke up thinking a man was at t   the edge of the bed. Feels they have increased since starting ditropan.   -Previously on Myrbetriq but now not approved by insurance   -Nocturia- can sleep for 6 hours now, up x1  -Urinary leakage better and feels he is emptying his bladder better  -Taking Flomax and Proscar  -Left testicular swelling continuing. No pain.    -Denies dysuria, flank pain, fever, chills, urgency    Scrotal US:   IMPRESSION: (12/28/17)??  1.  Multiple right epididymal cysts, the largest measuring up to 2.3 cm.  2.  Right varicocele.    VD:   Daytime Frequency: 5  Nocturia: 3  Maximum volume/void: 24 oz  Minimum volume/void: 8 oz  Average volume/void: 15oz  Total voided volume/day:73 oz  Total volume voided/night:39 oz  Total fluid intake/24 hours: 103 oz   Number of UUI episodes:1   Number of SUI episodes:0  Number of pads changed:1    Previous DRE: Moderately enlarged prostate.   Previous PVR: 16       Plan  1.) Stop Ditropan. Switch to Trospium. Reach out to  office or send my chart to let me know if hallucinations decrease or if continue on Trospium, if continue stop trospium and will try to get Myrbetriq approved. If hallucinations worsen or do not improve follow up with Neurologist since may be d/t PD.   2.) Continue Proscar and flomax.   3.) RTC in 2 month in office to assess  continue scrotal swelling. If swelling or pain continue then consider spermatocele.      This was a phone conversation in lieu of an in-person visit. The patient provided verbal consent for an over the phone visit.    I spent 20  to 24 minutes  on this call conducting an interview, performing a limited exam by phone, and educating the patient on my assessment and plan.      Medical Decision Making  The following items were considered in medical decision making:  Review / order clinical lab tests  Reviewed outside records    Physical Exam

## 2018-05-27 NOTE — Unmapped (Signed)
Left message for patient I will discuss with [name] and call back.

## 2018-05-27 NOTE — Unmapped (Signed)
Pt calling to report that his ins co that solfenacin, tolterobine,(spelling confirmed w/pt) Gala Murdoch, and myrbetriq. Pt had been on oxybutynin but it did not agree with him.

## 2018-05-27 NOTE — Unmapped (Signed)
Patient will check with insurance company for covered medications.

## 2018-05-27 NOTE — Unmapped (Signed)
Pt calling to report that the trospium is not covered by ins. He is req an alternative be called in to pharm.

## 2018-05-28 NOTE — Unmapped (Signed)
Discussed with [name].  Patient can try Myrbetriq 25 mg daily.  LM for patient to discuss.

## 2018-05-29 MED ORDER — solifenacin (VESICARE) 5 MG tablet
5 | ORAL_TABLET | Freq: Every day | ORAL | 3 refills | Status: AC
Start: 2018-05-29 — End: 2018-09-23

## 2018-05-29 NOTE — Unmapped (Signed)
Spoke with patient.  He stated he has tried Myrbetriq before.  Discussed with [name] again.  Patient can try Vesicare 5 mg daily.  Patient informed.

## 2018-05-29 NOTE — Unmapped (Signed)
Patient is returning a call to the office

## 2018-07-25 MED ORDER — oxybutynin (DITROPAN) 5 MG tablet
5 | ORAL_TABLET | ORAL | 0 refills | Status: AC
Start: 2018-07-25 — End: 2018-08-01

## 2018-07-25 NOTE — Unmapped (Signed)
Patient is requesting to speak with [name] regarding his examination coming up

## 2018-07-26 NOTE — Unmapped (Signed)
11:21am on 07/26/2018 Spoke with pt and informed him that on his visit with [name] on 08/02/2018 [name] will perform a scrotal exam to see if there is any increase in swelling or pain. If there is and it is bothersome we can discuss removing the the epididymal cysts which are located in the right scrotum.  Pt was satisfied and had no further questions at time of call

## 2018-08-01 ENCOUNTER — Ambulatory Visit: Admit: 2018-08-01 | Discharge: 2018-08-01 | Payer: MEDICARE | Attending: Adult Health

## 2018-08-01 DIAGNOSIS — G20 Parkinson's disease: Secondary | ICD-10-CM

## 2018-08-01 MED ORDER — rivastigmine tartrate (EXELON) 3 MG capsule
3 | ORAL_CAPSULE | Freq: Two times a day (BID) | ORAL | 0 refills | Status: AC
Start: 2018-08-01 — End: 2018-08-28

## 2018-08-01 NOTE — Unmapped (Signed)
1.  Continue Sinemet 3 tablets four times a day.   2.  Start Rivastigmine 3 mg capsules - Take 1 capsule daily for 1 week, then increase to 1 capsule twice a day.  3.  Physical Therapy referral - balance and gait.  4.  Start Miralax - take 1-2 cap fulls daily as needed for constipation.   5.  Restart [employer] when it opens back up.  6.  Reduce caffeine intake to 2 cups a day.

## 2018-08-01 NOTE — Unmapped (Signed)
Subjective:      Patient ID: William Russell is a 83 y.o. male.    Movement Disorders HPI       Handedness: Right  Chief Complaint: Parkinson's  Patient accompanied by and additional history obtained from: wife  Age at onset of symptoms: 32  Duration of symptoms: 3 years      This was a phone conversation in lieu of an in-person visit due to the COVID-19 emergency.     The patient presents for follow-up of PD. He is taking Sinemet 3 tablets at 8am, 1PM, 6PM, 11PM. He denies wearing off.      He has balance issues. He has had one fall since last visit. His wife says he loses his balance when stepping over objects. He did PT in the past and found it helpful.     He has frequency and urgency that has improved. He follows with Dr. Ree Shay a urologist here at [facility].    He stopped doing rock steady boxing due COVID-19 quarantine.  He hopes to restart soon.     He has hallucinations, where he sees birds flying around.     He has cognitive issues. He has trouble following a conversation. He has trouble following the plot of movie. He has short-term memory issues.     He has postural lightheadedness if he gets up too quickly, but no fainting.  He is trying to drink more fluids. He drinks 24 ounces of coffee a day.     He has constipation. He eats a well balanced meal.     ??  Initial evaluation:????The patient presents for an initial visit for tremor and gait difficulties with his wife and daughter. His wife noticed a tremor in his cheek a couple of years ago when he is eating or talking. He noticed a left leg tremor in 05/2016 at rest. ??After this he noticed his balance started to worsen and he started taking shorter steps. [place] Turns became more difficult. He developed a tremor in right right>??left hand a few months later. The hand tremor is mostly with action. He has difficulty with fine motor movements of his right hand. His handwriting is smaller. His wife feels his smell may be less sensitive.  He is having more drooling. He has had dream enactment behaviors for years. He reports mild short term memory issues. He can still do his ADLS and drives without difficulty. He has developed urinary urgency and incontinence since the summer.   ??  He has had more depression and anxiety since his wife was diagnosed with breast cancer in April of 2018.   ??  PD Quality Metrics  Falls (Review Each Visit): present  1 fall since last week  Motor Complications (Review Each Visit): present     Depression (Review Annually): present     Apathy (Review Annually): present     Anxiety (Review Annually): present     Cognitive Impairment (Review Annually): present  short term memory  Orthostatic Hypotension (Review Annually): denies     Insomnia/Sleep Disturbance (Review Annually): denies     PD Dx Review (Review Annually): yes     PD Med and Surg Option (Review Annually): yes     PD Rehab Options (Review Annually): yes     PD Safety Counseling (Review Annually): yes       Other Symptoms  Gait Freezing: denies     Excessive Sweating: denies     Urinary  Incontinence: present  urgency        Dysphagia: denies     Dream Enactment Behavior: present  rarely kicks, and vocalizations   Anosmia: present     Constipation: present     Hypophonia: present     Hypomimia: present     Micrographia: present        Med Side Effects  Nausea: denies     Vomiting: denies     Lightheadedness: present  infrequent  Fainting: denies     Daytime Sedation: denies     Dyskinesia: denies     Hallucinations: denies     Delusions: denies     Paranoia: denies     Impulse Control Disorder: denies       Motor Fluctuations             Imaging          Histories:     He has a past medical history of Depression, Hypertension, and OSA (obstructive sleep apnea).    He has a past surgical history that includes Shoulder surgery; Lumbar spine surgery; Cholecystectomy; and Carpal tunnel release.    His family history includes Cancer in his mother; Coronary artery disease  in his mother; Other in his father.    He reports that he has never smoked. He has never used smokeless tobacco. He reports that he does not drink alcohol or use drugs.    Review of Systems  Refer to HPI for review of systems documentation.  ROS was otherwise non-contributory.     Allergies:   Patient has no known allergies.    Medications:     Outpatient Encounter Medications as of 08/01/2018   Medication Sig Dispense Refill   ??? allopurinol (ZYLOPRIM) 300 MG tablet Take 300 mg by mouth daily.     ??? aspirin 325 MG tablet Take 325 mg by mouth daily.     ??? atorvastatin (LIPITOR) 20 MG tablet TAKE 1 TABLET BY MOUTH ONE TIME A DAY AT BEDTIME  3   ??? buPROPion XL (WELLBUTRIN XL) 150 MG 24 hr tablet      ??? calcium citrate-vitamin D (CITRACAL+D) 315-200 mg-unit Take 1 tablet by mouth daily.     ??? carbidopa-levodopa (SINEMET) 25-100 mg per tablet Take 3 tablets by mouth 4 times a day. 1080 tablet 3   ??? finasteride (PROSCAR) 5 mg tablet      ??? fluticasone propionate (FLONASE) 50 mcg/actuation nasal spray Use 1 spray into each nostril at bedtime.     ??? folic acid (FOLVITE) 400 MCG tablet Take 400 mcg by mouth daily.     ??? glucosamine-chondroitin 500-400 mg tablet Take 1 tablet by mouth daily.     ??? loratadine (CLARITIN) 10 mg tablet Take 10 mg by mouth daily as needed for Allergies.     ??? metoprolol succinate (TOPROL-XL) 25 MG 24 hr tablet Take 25 mg by mouth daily.     ??? montelukast (SINGULAIR) 10 mg tablet      ??? multivit-min/FA/lycopen/lutein (CENTRUM SILVER MEN ORAL) Take by mouth.     ??? mupirocin (BACTROBAN) 2 % ointment      ??? niacin 100 MG tablet Take 200 mg by mouth 2 times a day with meals.            ??? omega-3 fatty acids-fish oil 300-1,000 mg capsule Take 1 capsule by mouth daily.     ??? omeprazole (PRILOSEC) 20 MG capsule      ??? paroxetine (PAXIL) 30 MG tablet      ???  polyethylene glycol (GLYCOLAX) 17 gram/dose powder Take 17 g by mouth daily. 510 g 11   ??? solifenacin (VESICARE) 5 MG tablet Take 1 tablet (5 mg total)  by mouth daily. 30 tablet 3   ??? tamsulosin (FLOMAX) 0.4 mg Cap Take 0.4 mg by mouth at bedtime.     ??? oxybutynin (DITROPAN) 5 MG tablet TAKE ONE TABLET BY MOUTH THREE TIMES A DAY 270 tablet 0   ??? PARoxetine (PAXIL) 20 MG tablet Take 20 mg by mouth every morning.     ??? saw palmetto 500 MG capsule Take 500 mg by mouth daily.     ??? trospium (SANCTURA) 20 mg tablet Take 1 tablet (20 mg total) by mouth 2 times a day. 60 tablet 3     No facility-administered encounter medications on file as of 08/01/2018.        Objective:       There were no vitals filed for this visit.         Assessment:     1. Parkinson's disease (CMS Dx)    2. Constipation, unspecified constipation type    3. 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  He has urinary urgency that has improved.  He continues seeing Dr. Ree Shay for this. He has short-term memory concerns. His MOCA is 22/30. He has constipation that is bothersome.       Plan:     1.  Continue Sinemet 3 tablets four times a day.   2.  Start Rivastigmine 3 mg capsules - Take 1 capsule daily for 1 week, then increase to 1 capsule twice a day.  3.  Physical Therapy referral - balance and gait.  4.  Start Miralax - take 1-2 cap fulls daily as needed for constipation.   5.  Restart [employer] when it opens back up.  6.  Reduce caffeine intake to 2 cups a day.   7.  Follow-up with Dr. Gerilyn Pilgrim in 6 months.           This was a Phone conversation, in lieu of an in-person visit. The patient provided verbal consent to participate in the telehealth visit.   I spent 36-40 minutes speaking with the patient, conducting an interview, performing a  limited exam, and educating the patient on my assessment and plan. I also spent 10 minutes, on the same day as the encounter, obtaining and/or reviewing separately obtained history.    Lenn Sink, CNP

## 2018-08-02 ENCOUNTER — Ambulatory Visit: Admit: 2018-08-02 | Payer: MEDICARE | Attending: Family

## 2018-08-02 DIAGNOSIS — R350 Frequency of micturition: Secondary | ICD-10-CM

## 2018-08-02 LAB — POC URINALYSIS
Bilirubin, UA: NEGATIVE mg/dL
Blood, POC, UA: NEGATIVE
Glucose, POC, UA: NEGATIVE mg/dL
Ketones, POC, UA: NEGATIVE mg/dL
Leukocytes, POC, UA: NEGATIVE
Nitrite, POC, UA: NEGATIVE
Protein, POC, UA: NEGATIVE mg/dL
Spec Grav, UA: 1.015 (ref 1.005–1.035)
Urobilinogen, POC, UA: 0.2 EU/dL (ref 0.2–1.0)
pH, POC,UA: 7 (ref 5.0–8.0)

## 2018-08-02 NOTE — Unmapped (Signed)
Chief Complaint   Patient presents with   ??? Follow-up     Urgency   ??? Benign Prostatic Hypertrophy        History of Present Illness  Prior Office Visit 05/24/18   83y/o male with history of PD (03/2017), OSA, constipation, depression. Here for follow up     BPH with LUTS  -Ditropan caused hallucinations. Previously on Trospium too expensive, insurance did not approve mybetriq, but has previously tried, without benefit  -Taking Vesicare with benefit  -Taking Flomax and Proscar   -Daily urgency no leaks   -Frequency every 3-4 hours  -Nocturia- can sleep for 6 hours now, up x1  -Strong stream  -Hallucinations have been decreased since stopping ditropan   -Urinary leakage better and feels he is emptying his bladder well    Right epididymal cyst  -Right testicular swelling continuing. Not increased in size. No pain.    -Denies dysuria, flank pain, fever, chills, urgency      Scrotal US:   IMPRESSION: (12/28/17)??  1.  Multiple right epididymal cysts, the largest measuring up to 2.3 cm.  2.  Right varicocele.       Review of Systems   Constitutional: Negative for chills, diaphoresis, fatigue and fever.   Respiratory: Negative for cough, chest tightness, shortness of breath and wheezing.    Cardiovascular: Negative for chest pain, palpitations and leg swelling.   Gastrointestinal: Positive for constipation. Negative for abdominal distention, abdominal pain, diarrhea, heartburn, nausea and vomiting.   Genitourinary: Positive for frequency, scrotal swelling (Patient feels like right testicle is growing in size) and urgency. Negative for dysuria, flank pain, hematuria, nocturia and testicular pain.   Musculoskeletal: Positive for arthralgias, gait problem and joint swelling. Negative for back pain and myalgias.   Skin: Negative for rash and wound.   Neurological: Positive for tremors. Negative for dizziness, weakness and headaches.   Hematological: Does not bruise/bleed easily.   Psychiatric/Behavioral: Negative for  agitation, confusion and depression. The patient is not nervous/anxious.        Allergies  Patient has no known allergies.    Medications  Outpatient Encounter Medications as of 08/02/2018   Medication Sig Dispense Refill   ??? allopurinol (ZYLOPRIM) 300 MG tablet Take 300 mg by mouth daily.     ??? aspirin 325 MG tablet Take 325 mg by mouth daily.     ??? atorvastatin (LIPITOR) 20 MG tablet TAKE 1 TABLET BY MOUTH ONE TIME A DAY AT BEDTIME  3   ??? buPROPion XL (WELLBUTRIN XL) 150 MG 24 hr tablet      ??? calcium citrate-vitamin D (CITRACAL+D) 315-200 mg-unit Take 1 tablet by mouth daily.     ??? carbidopa-levodopa (SINEMET) 25-100 mg per tablet Take 3 tablets by mouth 4 times a day. 1080 tablet 3   ??? finasteride (PROSCAR) 5 mg tablet      ??? fluticasone propionate (FLONASE) 50 mcg/actuation nasal spray Use 1 spray into each nostril at bedtime.     ??? folic acid (FOLVITE) 400 MCG tablet Take 400 mcg by mouth daily.     ??? glucosamine-chondroitin 500-400 mg tablet Take 1 tablet by mouth daily.     ??? loratadine (CLARITIN) 10 mg tablet Take 10 mg by mouth daily as needed for Allergies.     ??? metoprolol succinate (TOPROL-XL) 25 MG 24 hr tablet Take 25 mg by mouth daily.     ??? montelukast (SINGULAIR) 10 mg tablet      ??? multivit-min/FA/lycopen/lutein (CENTRUM SILVER MEN ORAL)  Take by mouth.     ??? mupirocin (BACTROBAN) 2 % ointment      ??? niacin 100 MG tablet Take 200 mg by mouth 2 times a day with meals.            ??? omega-3 fatty acids-fish oil 300-1,000 mg capsule Take 1 capsule by mouth daily.     ??? omeprazole (PRILOSEC) 20 MG capsule      ??? paroxetine (PAXIL) 30 MG tablet      ??? polyethylene glycol (GLYCOLAX) 17 gram/dose powder Take 17 g by mouth daily. 510 g 11   ??? rivastigmine tartrate (EXELON) 3 MG capsule Take 1 capsule (3 mg total) by mouth 2 times a day. Take 1 capsule daily for one week, then increase to 1 capsule twice a day 60 capsule 0   ??? saw palmetto 500 MG capsule Take 500 mg by mouth daily.     ??? solifenacin  (VESICARE) 5 MG tablet Take 1 tablet (5 mg total) by mouth daily. 30 tablet 3   ??? tamsulosin (FLOMAX) 0.4 mg Cap Take 0.4 mg by mouth at bedtime.     ??? [DISCONTINUED] oxybutynin (DITROPAN) 5 MG tablet TAKE ONE TABLET BY MOUTH THREE TIMES A DAY 270 tablet 0   ??? [DISCONTINUED] PARoxetine (PAXIL) 20 MG tablet Take 20 mg by mouth every morning.     ??? [DISCONTINUED] trospium (SANCTURA) 20 mg tablet Take 1 tablet (20 mg total) by mouth 2 times a day. 60 tablet 3     No facility-administered encounter medications on file as of 08/02/2018.         Histories  He has a past medical history of Depression, Hypertension, and OSA (obstructive sleep apnea).    He has a past surgical history that includes Shoulder surgery; Lumbar spine surgery; Cholecystectomy; and Carpal tunnel release.    His family history includes Cancer in his mother; Coronary artery disease in his mother; Other in his father.    He reports that he has never smoked. He has never used smokeless tobacco. He reports that he does not drink alcohol or use drugs.    The following portions of the patient's history were reviewed and updated as appropriate: allergies, current medications, past family history, past medical history, past social history, past surgical history and problem list.    There were no vitals taken for this visit.  Physical Exam   Vitals reviewed.  Constitutional: He is oriented to person, place, and time. He appears well-developed and well-nourished.   HENT:   Head: Normocephalic and atraumatic.   Eyes: Pupils are equal, round, and reactive to light.   Neck: Normal range of motion. Neck supple. No tracheal deviation present.   Cardiovascular: Normal rate, regular rhythm and normal heart sounds.   Pulmonary/Chest: Effort normal and breath sounds normal. No respiratory distress. He has no wheezes. He has no rales. He exhibits no tenderness.   Abdominal: Soft. Bowel sounds are normal. He exhibits no distension and no mass. There is no abdominal  tenderness. There is no rebound and no guarding.   Genitourinary: Uncircumcised.    Genitourinary Comments: Small R hydrocele  No tenderness with examination  No signs of infection      Musculoskeletal: Normal range of motion.   Neurological: He is alert and oriented to person, place, and time.   Skin: Skin is warm and dry. No rash noted. No erythema.   Psychiatric: He has a normal mood and affect. His behavior is normal.  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       BPH with LUTS  -Ditropan caused hallucinations. Previously on Trospium too expensive, insurance did not approve mybetriq, but has previously tried, without benefit  -Taking Vesicare with benefit  -Taking Flomax and Proscar   -Daily urgency no leaks   -Frequency every 3-4 hours  -Nocturia- can sleep for 6 hours now, up x1  -Strong stream  -Hallucinations have been decreased since stopping ditropan   -Urinary leakage better and feels he is emptying his bladder well    Right epididymal cyst  -Right testicular swelling continuing. Not increased in size. No pain.    -Denies dysuria, flank pain, fever, chills, urgency      Scrotal US:   IMPRESSION: (12/28/17)??  1.  Multiple right epididymal cysts, the largest measuring up to 2.3 cm.  2.  Right varicocele.    VD:   Daytime Frequency: 5  Nocturia: 3  Maximum volume/void: 24 oz  Minimum volume/void: 8 oz  Average volume/void: 15oz  Total voided volume/day:73 oz  Total volume voided/night:39 oz  Total fluid intake/24 hours: 103 oz   Number of UUI episodes:1   Number of SUI episodes:0  Number of pads changed:1    Previous DRE: Moderately enlarged prostate.       PVR: 92 ml  UA: Normal        Plan  1.) Continue vesicare, proscar, and flomax. Wear jock strap for scrotal support/comfort for swelling. Discussed removal of cyst only if swelling increases, becomes painful, or becomes overly bothersome to patient. Patient comfortble with surveillance  of cyst.   2.) Scrotal US (to assess epididymal cyst growth) and and Renal US (NgB surveillance) in 4 month.  3.) RTC in 1 year or sooner with any concerns.          Medical Decision Making  The following items were considered in medical decision making:  Review / order clinical lab tests  Reviewed outside records

## 2018-08-26 MED ORDER — finasteride (PROSCAR) 5 mg tablet
5 | ORAL_TABLET | ORAL | 2 refills | Status: AC
Start: 2018-08-26 — End: 2018-12-11

## 2018-08-26 MED ORDER — tamsulosin (FLOMAX) 0.4 mg Cap
0.4 | ORAL_CAPSULE | ORAL | 2 refills | Status: AC
Start: 2018-08-26 — End: 2018-12-11

## 2018-08-26 MED ORDER — tamsulosin (FLOMAX) 0.4 mg Cap
0.4 | ORAL_CAPSULE | ORAL | 2 refills | Status: AC
Start: 2018-08-26 — End: 2018-08-26

## 2018-08-26 MED ORDER — finasteride (PROSCAR) 5 mg tablet
5 | ORAL_TABLET | ORAL | 2 refills | Status: AC
Start: 2018-08-26 — End: 2018-08-26

## 2018-08-29 MED ORDER — rivastigmine tartrate (EXELON) 3 MG capsule
3 | ORAL_CAPSULE | Freq: Two times a day (BID) | ORAL | 3 refills | Status: AC
Start: 2018-08-29 — End: 2019-10-07

## 2018-09-11 NOTE — Telephone Encounter (Signed)
The phone number provided for [facility] was actually the fax number. Pt's PT referral has been faxed and the pt has been informed. Call complete.

## 2018-09-11 NOTE — Telephone Encounter (Signed)
Pt is calling to inform that the order for PT was never sent to [facility]. Pt did not have a fax number. Pt would like a call back once this is done please.       Ph [phone]

## 2018-09-23 MED ORDER — solifenacin (VESICARE) 5 MG tablet
5 | ORAL_TABLET | ORAL | 2 refills | Status: AC
Start: 2018-09-23 — End: 2018-09-24

## 2018-09-24 MED ORDER — solifenacin (VESICARE) 5 MG tablet
5 | ORAL_TABLET | ORAL | 2 refills | Status: AC
Start: 2018-09-24 — End: 2019-06-18

## 2018-12-02 ENCOUNTER — Inpatient Hospital Stay: Admit: 2018-12-02 | Payer: MEDICARE | Attending: Family

## 2018-12-02 DIAGNOSIS — N5089 Other specified disorders of the male genital organs: Secondary | ICD-10-CM

## 2018-12-02 DIAGNOSIS — N319 Neuromuscular dysfunction of bladder, unspecified: Secondary | ICD-10-CM

## 2018-12-04 NOTE — Telephone Encounter (Signed)
Pt calling to req results of duplex ultrasound of abd-pel-scrotum.

## 2018-12-04 NOTE — Unmapped (Signed)
Marcos Eke, CNP  Yvonna Alanis, MA      ??      Raynelle Fanning,   Please let the patient know his renal US is normal. His scrotal ultrasound shows normal flow and the right epididymal cyst is still present. It is slightly bigger. From 2.3 cm to 2.6 cm. If no pain or its not bothersome we will continue to observe it.     Thank you, [name]      Patient informed.  He is not having any pain.

## 2018-12-12 MED ORDER — finasteride (PROSCAR) 5 mg tablet
5 | ORAL_TABLET | Freq: Every day | ORAL | 6 refills | Status: AC
Start: 2018-12-12 — End: 2019-07-23

## 2018-12-12 MED ORDER — tamsulosin (FLOMAX) 0.4 mg Cap
0.4 | ORAL_CAPSULE | Freq: Every evening | ORAL | 6 refills | Status: AC
Start: 2018-12-12 — End: 2019-07-17

## 2019-01-23 MED ORDER — carbidopa-levodopa (SINEMET) 25-100 mg per tablet
25-100 | ORAL_TABLET | ORAL | 3 refills | Status: AC
Start: 2019-01-23 — End: 2019-06-05

## 2019-02-25 NOTE — Telephone Encounter (Signed)
Patient appointment scheduled

## 2019-03-13 ENCOUNTER — Ambulatory Visit: Admit: 2019-03-13 | Payer: MEDICARE | Attending: Neurology

## 2019-03-13 DIAGNOSIS — R4189 Other symptoms and signs involving cognitive functions and awareness: Secondary | ICD-10-CM

## 2019-03-13 MED ORDER — rivastigmine tartrate (EXELON) 4.5 MG capsule
4.5 | ORAL_CAPSULE | Freq: Two times a day (BID) | ORAL | 3 refills | Status: AC
Start: 2019-03-13 — End: 2019-06-05

## 2019-03-13 NOTE — Unmapped (Signed)
Subjective:      Patient ID: William Russell is a 84 y.o. male.    Movement Disorders HPI       Handedness: Right  Chief Complaint: Parkinson's  Patient accompanied by and additional history obtained from: wife  Age at onset of symptoms: 49  Duration of symptoms: 4 years      The patient presents for follow-up of PD. He has had 5-6 falls in the last 3 months. He falls when he turns too quickly. He denies any lightheadedness or fainting. He is taking Sinemet 3 tablets at 8am, 1PM, 6PM, 11PM. He denies any wearing off if he takes it on time. He was having hallucinations when they started him on oxybutynin in urology and this was stopped and the hallucinations improved. His wife feels his cognition isn't as good as it used to be. He will lose his keys more frequently. 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 His wife says he has a shorter fuse than he used to be and he agrees. Constipation is an issue.     He reports paresthesias in his right thumb, and first two fingers. He saw ortho for this and had an injection and he is wearing a wrist brace for this.    ??  Initial evaluation:????The patient presents for an initial visit for tremor and gait difficulties with his wife and daughter. His wife noticed a tremor in his cheek a couple of years ago when he is eating or talking. He noticed a left leg tremor in 05/2016 at rest. ??After this he noticed his balance started to worsen and he started taking shorter steps. [place] Turns became more difficult. He developed a tremor in right right>??left hand a few months later. The hand tremor is mostly with action. He has difficulty with fine motor movements of his right hand. His handwriting is smaller. His wife feels his smell may be less sensitive. He is having more drooling. He has had dream enactment behaviors for years. He reports mild short term memory issues. He can still do his  ADLS and drives without difficulty. He has developed urinary urgency and incontinence since the summer.   ??  He has had more depression and anxiety since his wife was diagnosed with breast cancer in April of 2018.   ??  PD Quality Metrics  Falls (Review Each Visit): present  5-6 falls  Motor Complications (Review Each Visit): present     Depression (Review Annually): present     Apathy (Review Annually): present     Anxiety (Review Annually): present     Cognitive Impairment (Review Annually): present  short term memory  Orthostatic Hypotension (Review Annually): denies     Insomnia/Sleep Disturbance (Review Annually): denies     PD Dx Review (Review Annually): yes     PD Med and Surg Option (Review Annually): yes     PD Rehab Options (Review Annually): yes     PD Safety Counseling (Review Annually): yes       Other Symptoms  Gait Freezing: denies     Excessive Sweating: denies     Urinary Incontinence: present  urgency        Dysphagia: denies     Dream Enactment Behavior: present  rarely kicks, and vocalizations   Anosmia: present     Constipation: present     Hypophonia: present  Hypomimia: present     Micrographia: present        Med Side Effects  Nausea: denies     Vomiting: denies     Lightheadedness: present  infrequent  Fainting: denies     Daytime Sedation: denies     Dyskinesia: denies     Hallucinations: denies     Delusions: denies     Paranoia: denies     Impulse Control Disorder: denies       Motor Fluctuations             Imaging          Histories:     He has a past medical history of Depression, Hypertension, and OSA (obstructive sleep apnea).    He has a past surgical history that includes Shoulder surgery; Lumbar spine surgery; Cholecystectomy; and Carpal tunnel release.    His family history includes Cancer in his mother; Coronary artery disease in his mother; Other in his father.    He reports that he has never smoked. He has never used smokeless tobacco. He reports that he does not drink  alcohol or use drugs.    Review of Systems  Refer to HPI for review of systems documentation.  ROS was otherwise non-contributory.     Allergies:   Patient has no known allergies.    Medications:     Outpatient Encounter Medications as of 03/13/2019   Medication Sig Dispense Refill   ??? allopurinol (ZYLOPRIM) 300 MG tablet Take 300 mg by mouth daily.     ??? aspirin 325 MG tablet Take 325 mg by mouth daily.     ??? atorvastatin (LIPITOR) 20 MG tablet TAKE 1 TABLET BY MOUTH ONE TIME A DAY AT BEDTIME  3   ??? buPROPion XL (WELLBUTRIN XL) 150 MG 24 hr tablet      ??? calcium citrate-vitamin D (CITRACAL+D) 315-200 mg-unit Take 1 tablet by mouth daily.     ??? carbidopa-levodopa (SINEMET) 25-100 mg per tablet TAKE THREE TABLETS BY MOUTH FOUR TIMES A DAY 560 tablet 3   ??? finasteride (PROSCAR) 5 mg tablet Take 1 tablet (5 mg total) by mouth daily. 30 tablet 6   ??? fluticasone propionate (FLONASE) 50 mcg/actuation nasal spray Use 1 spray into each nostril at bedtime.     ??? folic acid (FOLVITE) 400 MCG tablet Take 400 mcg by mouth daily.     ??? glucosamine-chondroitin 500-400 mg tablet Take 1 tablet by mouth daily.     ??? loratadine (CLARITIN) 10 mg tablet Take 10 mg by mouth daily as needed for Allergies.     ??? metoprolol succinate (TOPROL-XL) 25 MG 24 hr tablet Take 25 mg by mouth daily.     ??? montelukast (SINGULAIR) 10 mg tablet      ??? multivit-min/FA/lycopen/lutein (CENTRUM SILVER MEN ORAL) Take by mouth.     ??? mupirocin (BACTROBAN) 2 % ointment      ??? niacin 100 MG tablet Take 200 mg by mouth 2 times a day with meals.            ??? omega-3 fatty acids-fish oil 300-1,000 mg capsule Take 1 capsule by mouth daily.     ??? omeprazole (PRILOSEC) 20 MG capsule      ??? paroxetine (PAXIL) 30 MG tablet      ??? polyethylene glycol (GLYCOLAX) 17 gram/dose powder Take 17 g by mouth daily. 510 g 11   ??? saw palmetto 500 MG capsule Take 500 mg by mouth daily.     ???  solifenacin (VESICARE) 5 MG tablet TAKE ONE TABLET BY MOUTH DAILY 90 tablet 2   ???  tamsulosin (FLOMAX) 0.4 mg Cap Take 1 capsule by mouth at bedtime. 30 capsule 6   ??? [DISCONTINUED] rivastigmine tartrate (EXELON) 3 MG capsule Take 1 capsule (3 mg total) by mouth 2 times a day. 180 capsule 3   ??? rivastigmine tartrate (EXELON) 4.5 MG capsule Take 1 capsule (4.5 mg total) by mouth 2 times a day. 180 capsule 3     No facility-administered encounter medications on file as of 03/13/2019.            Objective:         Vitals:    03/13/19 1039   BP: 106/60   Pulse: 81   Weight: 186 lb (84.4 kg)   Height: 5' 8 (1.727 m)       MDS UPDRS part 3 - Motor Examination     General  3.1 Speech: 1  3.2 Facial Expression: 1     Coordination   3.4a Finger tapping, right : 2  3.4b Finger tapping, left: 2  3.5a Hand movement, right: 1  3.5b Hand movement, left: 1  3.6a Pronation-supination of right hand: 1  3.6b Pronation-supination of left hand: 1  3.7a Toe tapping, right : 2  3.7b Toe Tapping, left: 2  3.8a Leg agility, right: 1  3.8b Leg agility, left: 1  Posture and Gait  3.9 Arising from chair : 1  3.10 Gait: 1  3.11 Freezing of gait: 0  3.12 Postural Stability: 0  3.13 Posture: 0  3.14 Body bradykinesia: 1  Tremor  3.15a Postural tremor, right hand: 0  3.15b Postural Tremor, left hand: 0  3.16a Kinetic Tremor of right hand: 0  3.16b Kinetic tremor of left hand: 0  3.17e Lip/jaw rest tremor: 0  3.17a RUE Rest Tremor Amplitude: 0  3.17b LUE Rest Tremor Amplitude: 1  3.17c RLE Rest Tremor Amplitude: 0  3.17d LLE Rest Tremor Amplitude: 0  3.17e Constancy of Rest tremor: 1     MDS UPDRS 3 Total: 21          Assessment:     1. Idiopathic Parkinson's disease (CMS Dx)    2. Cognitive changes      The patient has parkinsonism in the form of asymmetric resting tremor, bradykinesia and mild rigidity with a good response to levodopa which in the absence of atypical features likely represents idiopathic Parkinson's disease. 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  Motor symptoms are fairly well controlled. Cognitive issues are worse. Constipation remains an issue.     Plan:     1.  Continue Sinemet 3 tablets four times a day.   2.  Increase Rivastigmine to 4.5 mg BID.   3. Refer for neuropsychological testing.   4. Start Miralax 1/2 capful daily.   5.  Exercise.   6. Follow-up with Shela Nevin, CNP in 3 months and with Dr. Rema Jasmine in 6 months (60 minutes).           I have spent 40 minutes face to face with this patient with greater than 50% of this time spent in counseling and coordination of care discussing the above issues.  See Assessment and Plan for details.

## 2019-06-02 NOTE — Telephone Encounter (Signed)
Lt msg to call back to change pt to telehealth visit as Boneta Lucks will not be in the office on 06/05/19.

## 2019-06-04 NOTE — Telephone Encounter (Signed)
Tried calling pt number in epic it is now saying mailbox full also tried calling spouse numbers in contacts no answer and and vm not set up . Lenn Sink will not be in the office tomorrow 06/05/19 pt will need to be r/s or be converted to telehealth. )

## 2019-06-04 NOTE — Telephone Encounter (Signed)
Tried calling pt again regarding appt with Lenn Sink CNP for 06/05/19 no answer vm full.

## 2019-06-05 ENCOUNTER — Ambulatory Visit: Admit: 2019-06-05 | Payer: MEDICARE | Attending: Adult Health

## 2019-06-05 DIAGNOSIS — G20 Parkinson's disease: Secondary | ICD-10-CM

## 2019-06-05 MED ORDER — rivastigmine tartrate (EXELON) 6 MG capsule
6 | ORAL_CAPSULE | Freq: Two times a day (BID) | ORAL | 3 refills | Status: AC
Start: 2019-06-05 — End: 2020-01-16

## 2019-06-05 MED ORDER — carbidopa-levodopa (SINEMET) 25-100 mg per tablet
25-100 | ORAL_TABLET | Freq: Four times a day (QID) | ORAL | 3 refills | Status: AC
Start: 2019-06-05 — End: 2020-04-13

## 2019-06-05 NOTE — Unmapped (Signed)
Subjective:      Patient ID: DASAN HARDMAN is a 84 y.o. male.    Movement Disorders HPI       Handedness: Right  Chief Complaint: Parkinson's  Patient accompanied by and additional history obtained from: wife  Age at onset of symptoms: 64  Duration of symptoms: 4 years    The patient presents for phone visit for followup of PD. He saw Dr. Gerilyn Pilgrim in January and we increased his rivastigmine to 4.5 mg twice a day and referred for neuropsychological evaluation.     He continues have trouble with memory and word finding difficulties. He has not done neuropsychological testing as of yet. He says scheduling has not reached out to him for an appointment.    He is taking Sinemet 3 tabs four times a day (8a, 1pm, 6pm,11pm). He has wearing off if he is late taking the medication. He uses a phone alarm to help with medication compliance.  He stumbles. His wife notices if he turns quickly he may lose his balance. He has had a few falls if bending over to pick something up off the ground.  His handwriting is small. He has trouble fine motor movements. He has trouble turns the pages of a book and newspaper. He goes to PT 2x week for balance and gait and [employer].    He endorses postural lightheadedness when getting up from the floor, however this resolves in a few seconds.     Constipation is managed with Miralax and prune juice \\.    Mood is stable. He admits to getting agitated at times.     PD Quality Metrics  Falls (Review Each Visit): present  few falls  Motor Complications (Review Each Visit): present     Depression (Review Annually): present     Apathy (Review Annually): present     Anxiety (Review Annually): present     Cognitive Impairment (Review Annually): present  short term memory  Orthostatic Hypotension (Review Annually): denies     Insomnia/Sleep Disturbance (Review Annually): denies     PD Dx Review (Review Annually): yes     PD Med and Surg Option (Review Annually): yes     PD Rehab Options (Review  Annually): yes     PD Safety Counseling (Review Annually): yes       Other Symptoms  Gait Freezing: denies     Excessive Sweating: denies     Urinary Incontinence: present  urgency        Dysphagia: denies     Dream Enactment Behavior: present  rarely kicks, and vocalizations   Anosmia: present     Constipation: present  managed with miralax and prune juice  Hypophonia: present     Hypomimia: present     Micrographia: present        Med Side Effects  Nausea: denies     Vomiting: denies     Lightheadedness: present  infrequent  Fainting: denies     Daytime Sedation: denies     Dyskinesia: denies     Hallucinations: denies     Delusions: denies     Paranoia: denies     Impulse Control Disorder: denies       Motor Fluctuations        Wearing Off Prior to Next Dose: if late taking the Sinemet    Imaging          Histories:     He has a past medical history of Depression, Hypertension, and  OSA (obstructive sleep apnea).    He has a past surgical history that includes Shoulder surgery; Lumbar spine surgery; Cholecystectomy; and Carpal tunnel release.    His family history includes Cancer in his mother; Coronary artery disease in his mother; Other in his father.    He reports that he has never smoked. He has never used smokeless tobacco. He reports that he does not drink alcohol or use drugs.    Review of Systems  Refer to HPI for review of systems documentation.  ROS was otherwise non-contributory.     Allergies:   Patient has no known allergies.    Medications:     Outpatient Encounter Medications as of 06/05/2019   Medication Sig Dispense Refill   ??? allopurinol (ZYLOPRIM) 300 MG tablet Take 300 mg by mouth daily.     ??? aspirin 325 MG tablet Take 325 mg by mouth daily.     ??? atorvastatin (LIPITOR) 20 MG tablet TAKE 1 TABLET BY MOUTH ONE TIME A DAY AT BEDTIME  3   ??? buPROPion XL (WELLBUTRIN XL) 150 MG 24 hr tablet      ??? calcium citrate-vitamin D (CITRACAL+D) 315-200 mg-unit Take 1 tablet by mouth daily.     ???  carbidopa-levodopa (SINEMET) 25-100 mg per tablet Take 3.5 tablets by mouth 4 times a day. 1260 tablet 3   ??? finasteride (PROSCAR) 5 mg tablet Take 1 tablet (5 mg total) by mouth daily. 30 tablet 6   ??? fluticasone propionate (FLONASE) 50 mcg/actuation nasal spray Use 1 spray into each nostril at bedtime.     ??? folic acid (FOLVITE) 400 MCG tablet Take 400 mcg by mouth daily.     ??? glucosamine-chondroitin 500-400 mg tablet Take 1 tablet by mouth daily.     ??? loratadine (CLARITIN) 10 mg tablet Take 10 mg by mouth daily as needed for Allergies.     ??? metoprolol succinate (TOPROL-XL) 25 MG 24 hr tablet Take 25 mg by mouth daily.     ??? montelukast (SINGULAIR) 10 mg tablet      ??? multivit-min/FA/lycopen/lutein (CENTRUM SILVER MEN ORAL) Take by mouth.     ??? niacin 100 MG tablet Take 200 mg by mouth 2 times a day with meals.            ??? omega-3 fatty acids-fish oil 300-1,000 mg capsule Take 1 capsule by mouth daily.     ??? omeprazole (PRILOSEC) 20 MG capsule      ??? paroxetine (PAXIL) 30 MG tablet      ??? polyethylene glycol (GLYCOLAX) 17 gram/dose powder Take 17 g by mouth daily. 510 g 11   ??? rivastigmine tartrate (EXELON) 6 MG capsule Take 1 capsule (6 mg total) by mouth 2 times a day. Indications: MEMORY 180 capsule 3   ??? solifenacin (VESICARE) 5 MG tablet TAKE ONE TABLET BY MOUTH DAILY 90 tablet 2   ??? tamsulosin (FLOMAX) 0.4 mg Cap Take 1 capsule by mouth at bedtime. 30 capsule 6   ??? [DISCONTINUED] carbidopa-levodopa (SINEMET) 25-100 mg per tablet TAKE THREE TABLETS BY MOUTH FOUR TIMES A DAY 560 tablet 3   ??? [DISCONTINUED] rivastigmine tartrate (EXELON) 4.5 MG capsule Take 1 capsule (4.5 mg total) by mouth 2 times a day. 180 capsule 3   ??? mupirocin (BACTROBAN) 2 % ointment      ??? saw palmetto 500 MG capsule Take 500 mg by mouth daily.       No facility-administered encounter medications on file as of 06/05/2019.  Objective:       There were no vitals filed for this visit.                  Assessment:     1. Idiopathic  Parkinson's disease (CMS Dx)    2. Fall, initial encounter    3. Unsteady gait    4. Parkinson disease (CMS Dx)    5. Memory difficulties      The patient has parkinsonism in the form of asymmetric resting tremor, bradykinesia and mild rigidity with a??good??response to levodopa which in the absence of atypical features likely represents idiopathic Parkinson's disease. 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 Fine motor symptoms are present and bothersome. He has balance and gait difficulties that could improve with PT. Cognitive issues persists.     Plan:     1.  Increase Sinemet from 3 tablets four times a day to 3.5 tabs four times a day to help with fine motor movements and balance.  2.  In 2 weeks Increase rivastigmine from 4.5 mg BID to 6 mg BID for cognition.  3.  Continue Miralax as needed for constipation.   4.  Continue exercising for gait and balance as he is doing.   5.  PT referral mailed out.   6.  Neuropsychological evaluation  7.  Follow-up with Dr. Rema Jasmine 4 months (TOC).            Portion of this note have been copied and forward but have been reviewed and updated if needed and are accurate to date.      This was a Phone conversation, in lieu of an in-person visit. The patient provided verbal consent to participate in the telehealth visit.   I spent 50 minutes speaking with the patient, conducting an interview, performing a limited exam, and educating the patient on my assessment and plan. I also spent 5 minutes, on the same day as the encounter, obtaining and/or reviewing separately obtained history.    Lenn Sink, CNP

## 2019-06-05 NOTE — Unmapped (Addendum)
1. Increase Sinemet from 3 tablets four times a day to 3.5 tabs four times a day to help with fine motor movements and balance  2. In 2 weeks - Increase rivastigmine from 4.5 mg BID to 6 mg BID for cognition.   3.  Continue Miralax as needed for constipation.   4.  Continue exercising for gait and balance as he is doing.   5.  PT referral mailed out.   6.  Follow-up with Dr. Rema Jasmine 4 months  7.  Neuropsych evaluation

## 2019-06-05 NOTE — Telephone Encounter (Signed)
Was able to speak with pt this morning and convert appt to telephone visit and prechart

## 2019-06-18 MED ORDER — solifenacin (VESICARE) 5 MG tablet
5 | ORAL_TABLET | Freq: Every day | ORAL | 0 refills | Status: AC
Start: 2019-06-18 — End: 2019-10-29

## 2019-06-18 NOTE — Telephone Encounter (Signed)
Pt needs refill of Solifenacin 5mg  - [facility] in [place] - would like to speak with [name] also - having issues does he need to come in sooner

## 2019-06-18 NOTE — Telephone Encounter (Signed)
Spoke with patient.  He c/o the right testicle hangs very low compared to the left testicle.  No pain or swelling.  He has yearly appointment with Leotis Shames in June.  Patient informed she would need to examine him at that appointment.  We will call him if a sooner appointment becomes available.  He should also call if he develops pain or swelling.  [name] informed.  Rx called in.

## 2019-06-24 NOTE — Telephone Encounter (Signed)
If pt gets Solifenacin refilled again it is going to cost him $1300.  Pharmacist is recommending Oxybutynin (pt thinks this caused him to Hallucinate) or  Tolterodine

## 2019-06-24 NOTE — Telephone Encounter (Signed)
I will discuss with [name] tomorrow.  LM for patient.

## 2019-06-25 MED ORDER — tolterodine (DETROL LA) 4 MG 24 hr capsule
4 | ORAL_CAPSULE | Freq: Every day | ORAL | 3 refills | Status: AC
Start: 2019-06-25 — End: 2019-09-16

## 2019-06-25 NOTE — Telephone Encounter (Signed)
Discussed with [name].  Patient can try Tolterodine 4 mg.  Patient informed and Rx called in.  Patient will follow up at scheduled visit in June.

## 2019-07-17 MED ORDER — tamsulosin (FLOMAX) 0.4 mg Cap
0.4 | ORAL_CAPSULE | ORAL | 0 refills | Status: AC
Start: 2019-07-17 — End: 2019-10-06

## 2019-07-17 NOTE — Telephone Encounter (Signed)
Called patient to reschedule August 13 appointment, no answer lvm requesting a call back.  Patient can be seen on July 30 at 9 AM

## 2019-07-23 MED ORDER — finasteride (PROSCAR) 5 mg tablet
5 | ORAL_TABLET | Freq: Every day | ORAL | 0 refills | Status: AC
Start: 2019-07-23 — End: 2019-08-22

## 2019-07-25 NOTE — Telephone Encounter (Signed)
Called and spoke with [name] she stated it is okay for patient to go back to [place], pt advised

## 2019-07-25 NOTE — Telephone Encounter (Signed)
PT referral has been faxed, call complete.

## 2019-07-25 NOTE — Telephone Encounter (Signed)
Olivia fr [facility] is calling to request the order for PT to be faxed.         Fax [phone]

## 2019-07-25 NOTE — Telephone Encounter (Addendum)
Pt states tolterodine is costing too much out of pocket. Pt states he called insurance and they are willing to cover the alternative drug (solifenacin?) that originally was not covered. Pt requesting to switch back. Also, states tolterodine is causing side effects as well. Pt states pharmacy still has solifenacin? prescription on file.

## 2019-08-01 ENCOUNTER — Ambulatory Visit: Payer: MEDICARE | Attending: Family

## 2019-08-29 ENCOUNTER — Ambulatory Visit: Admit: 2019-08-29 | Payer: MEDICARE | Attending: Family

## 2019-08-29 DIAGNOSIS — N5089 Other specified disorders of the male genital organs: Secondary | ICD-10-CM

## 2019-08-29 NOTE — Patient Instructions (Addendum)
Holmium laser enuclation of the prostate if enlarged prostate is seen during cystoscopy.   Other options of over active bladder: Botox injections into the bladder and Interstim     Scheduled for cystoscopy to assess the prostate and urodynamics

## 2019-08-29 NOTE — Unmapped (Signed)
Review of Systems   Constitutional: Negative for diaphoresis, malaise/fatigue and weight loss.   HENT: Negative for ear pain, hearing loss and tinnitus.    Eyes: Negative for pain, discharge and redness.   Respiratory: Negative for cough and hemoptysis.    Cardiovascular: Negative for claudication, leg swelling and PND.   Gastrointestinal: Negative for blood in stool, constipation and melena.   Genitourinary: Negative for dysuria, frequency and urgency.   Musculoskeletal: Negative for back pain, myalgias and neck pain.   Skin: Negative for itching and rash.   Neurological: Negative for dizziness, tingling and headaches.   Endo/Heme/Allergies: Negative for environmental allergies and polydipsia. Does not bruise/bleed easily.   Psychiatric/Behavioral: Negative for depression, substance abuse and suicidal ideas.

## 2019-08-29 NOTE — Unmapped (Signed)
Chief Complaint   Patient presents with   ??? Neurogenic Bladder        History of Present Illness  84y/o male with history of PD (03/2017), OSA, constipation, depression. Here for follow up     BPH with LUTS  -Ditropan caused hallucinations. Trospium and detrol too expensive,  insurance did not approve mybetriq, but has previously tried, without benefit  -Taking Vesicare with benefit but causing memory issues  -Taking Flomax and Proscar. Reports strong urinary stream and complete bladder emptying.    -Daily urgency no leaks   -Frequency every 3-4 hours  -Nocturia- can sleep for 6 hours now, up x1    Right epididymal cyst  -Right testicular swelling continuing, reports this has increased. No pain.    -Denies dysuria, flank pain, fever, chills, urgency      Scrotal US (11/2018):   1. ??Multiple right epididymal cysts, the largest measuring 2.6 cm, unchanged given differences in projection.   2. ??No focal testicular mass   3. ??Stable unilateral right varicocele. No retroperitoneal lymphadenopathy is described on outside abdominal imaging reports from June and July 2019.         Review of Systems   See MA note     Allergies  Oxybutynin    Medications  Outpatient Encounter Medications as of 08/29/2019   Medication Sig Dispense Refill   ??? allopurinol (ZYLOPRIM) 300 MG tablet Take 300 mg by mouth daily.     ??? aspirin 325 MG tablet Take 325 mg by mouth daily.     ??? atorvastatin (LIPITOR) 20 MG tablet TAKE 1 TABLET BY MOUTH ONE TIME A DAY AT BEDTIME  3   ??? buPROPion XL (WELLBUTRIN XL) 150 MG 24 hr tablet      ??? calcium citrate-vitamin D (CITRACAL+D) 315-200 mg-unit Take 1 tablet by mouth daily.     ??? carbidopa-levodopa (SINEMET) 25-100 mg per tablet Take 3.5 tablets by mouth 4 times a day. 1260 tablet 3   ??? fluticasone propionate (FLONASE) 50 mcg/actuation nasal spray Use 1 spray into each nostril at bedtime.     ??? folic acid (FOLVITE) 400 MCG tablet Take 400 mcg by mouth daily.     ??? glucosamine-chondroitin 500-400 mg tablet  Take 1 tablet by mouth daily.     ??? loratadine (CLARITIN) 10 mg tablet Take 10 mg by mouth daily as needed for Allergies.     ??? metoprolol succinate (TOPROL-XL) 25 MG 24 hr tablet Take 25 mg by mouth daily.     ??? montelukast (SINGULAIR) 10 mg tablet      ??? multivit-min/FA/lycopen/lutein (CENTRUM SILVER MEN ORAL) Take by mouth.     ??? mupirocin (BACTROBAN) 2 % ointment      ??? niacin 100 MG tablet Take 200 mg by mouth 2 times a day with meals.            ??? omega-3 fatty acids-fish oil 300-1,000 mg capsule Take 1 capsule by mouth daily.     ??? omeprazole (PRILOSEC) 20 MG capsule      ??? paroxetine (PAXIL) 30 MG tablet      ??? polyethylene glycol (GLYCOLAX) 17 gram/dose powder Take 17 g by mouth daily. 510 g 11   ??? rivastigmine tartrate (EXELON) 6 MG capsule Take 1 capsule (6 mg total) by mouth 2 times a day. Indications: MEMORY 180 capsule 3   ??? saw palmetto 500 MG capsule Take 500 mg by mouth daily.     ??? solifenacin (VESICARE) 5 MG tablet Take  1 tablet (5 mg total) by mouth daily. 90 tablet 0   ??? tamsulosin (FLOMAX) 0.4 mg Cap TAKE ONE CAPSULE BY MOUTH AT BEDTIME 60 capsule 0   ??? tolterodine (DETROL LA) 4 MG 24 hr capsule Take 1 capsule (4 mg total) by mouth daily. 30 capsule 3     No facility-administered encounter medications on file as of 08/29/2019.        Histories  He has a past medical history of Depression, Hypertension, and OSA (obstructive sleep apnea).    He has a past surgical history that includes Shoulder surgery; Lumbar spine surgery; Cholecystectomy; and Carpal tunnel release.    His family history includes Cancer in his mother; Coronary artery disease in his mother; Other in his father.    He reports that he has never smoked. He has never used smokeless tobacco. He reports that he does not drink alcohol and does not use drugs.    The following portions of the patient's history were reviewed and updated as appropriate: allergies, current medications, past family history, past medical history, past social  history, past surgical history and problem list.    There were no vitals taken for this visit.  Physical Exam   Vitals reviewed.  Constitutional: He is oriented to person, place, and time. He appears well-developed.   HENT:   Head: Normocephalic and atraumatic.   Eyes: Pupils are equal, round, and reactive to light.   Neck: No tracheal deviation present.   Cardiovascular: Normal rate, regular rhythm and normal heart sounds.   Pulmonary/Chest: Effort normal and breath sounds normal. No respiratory distress. He has no wheezes. He has no rales. He exhibits no tenderness.   Abdominal: Soft. Bowel sounds are normal. He exhibits no distension and no mass. There is no abdominal tenderness. There is no rebound and no guarding.   Genitourinary: Uncircumcised.    Genitourinary Comments: Multiple right epididymal cyst palpated   No tenderness with examination  No signs of infection      Musculoskeletal:         General: Normal range of motion.      Cervical back: Normal range of motion and neck supple.   Neurological: He is alert and oriented to person, place, and time.   Skin: Skin is warm and dry. No rash noted. No erythema.   Psychiatric: His behavior is normal.           Assessment  84y/o male with history of PD (03/2017), OSA, constipation, depression referred for UUI.     BPH with LUTS  -Ditropan caused hallucinations. Trospium and detrol too expensive,  insurance did not approve mybetriq, but has previously tried, without benefit  -Taking Vesicare with benefit but causing memory issues  -Taking Flomax and Proscar. Reports strong urinary stream and complete bladder emptying.    -Daily urgency no leaks   -Frequency every 3-4 hours  -Nocturia- can sleep for 6 hours now, up x1    Right epididymal cyst  -Right testicular swelling continuing, reports this has increased. No pain.    -Denies dysuria, flank pain, fever, chills, urgency      Scrotal US (11/2018):   1. ??Multiple right epididymal cysts, the largest measuring 2.6 cm,  unchanged given differences in projection.   2. ??No focal testicular mass   3. ??Stable unilateral right varicocele. No retroperitoneal lymphadenopathy is described on outside abdominal imaging reports from June and July 2019.          Prior VD:   Daytime Frequency:  5  Nocturia: 3  Maximum volume/void: 24 oz  Minimum volume/void: 8 oz  Average volume/void: 15oz  Total voided volume/day:73 oz  Total volume voided/night:39 oz  Total fluid intake/24 hours: 103 oz   Number of UUI episodes:1   Number of SUI episodes:0  Number of pads changed:1      Prior PVR: 92 ml     Plan  1.) Right epididymal cyst: Multiple right epididymal cysts palpated on exam, no concern for infection, no significant swelling. Scrotal US ordered to assess for growth. Patient bothered by cysts and is wanting them removed.  2.) BPH with LUTS, NGB d/t PD: Stop vesicare d/t memory issues increased since starting. Discussed his OAB symptoms will likely worsen since he is stopping his medication and has previously failed alternatives. Continue proscar, and flomax.   3.) RTC for cystoscopy and urodynamics. Patient has failed combination therapy (alpha blocker + antimuscarinic). We discussed that his urinary symptoms are likely d/t a combination of his prostate and his parkinson's disease. We discussed Line C OAB treatments that are available and also prostate procedures if the work up shows his prostate is enlarged. We discussed that both of these conditions effect his urinary symptoms and even if his prostate was found to be enlarged and he has a prostate procedure he still might have OAB symptoms d/t his parkinson's. We will have a better idea of the best way to help him after the UDS and cysto. Patient information given of UDS, cystoscopy, and minimally invasive procedures. We discussed we may be able to combine right epididymal cyst removal with other procedure needed for her urinary symptoms.        Time spent in this encounter for chart review and  patient care was 30 minutes.              Medical Decision Making  The following items were considered in medical decision making:  Review / order clinical lab tests  Reviewed outside records

## 2019-09-08 ENCOUNTER — Ambulatory Visit: Payer: MEDICARE | Attending: Family

## 2019-09-11 ENCOUNTER — Inpatient Hospital Stay: Payer: MEDICARE | Attending: Family

## 2019-09-11 ENCOUNTER — Ambulatory Visit: Payer: MEDICARE

## 2019-09-15 NOTE — Unmapped (Signed)
Patients wife/William Russell  She wants MD to know a few changes since last appt with Dr Christella Hartigan but does not want patient to know that she called    Dr Christella Hartigan added more Sinemet and patient is not as sharp cogitatively  Patient is more unsteady  William Russell has to explain things more often  Patient has seen people in bedroom     Call William Russell with any questions [phone]    Patient has been under a lot of stress this summer so could this be the cause of his symptoms

## 2019-09-16 ENCOUNTER — Ambulatory Visit: Admit: 2019-09-16 | Payer: MEDICARE | Attending: Neurology

## 2019-09-16 DIAGNOSIS — G20 Parkinson's disease: Secondary | ICD-10-CM

## 2019-09-16 MED ORDER — finasteride (PROSCAR) 5 mg tablet
5 | ORAL_TABLET | ORAL | 8 refills | Status: AC
Start: 2019-09-16 — End: 2020-04-20

## 2019-09-16 NOTE — Unmapped (Signed)
Subjective:      Patient ID: William Russell is a 84 y.o. male.    Movement Disorders HPI       Handedness: Right  Chief Complaint: Parkinson's  Patient accompanied by and additional history obtained from: wife  Age at onset of symptoms: 56  Duration of symptoms: 4 years    Follow up for Parkinson's disease with dream enactment behaviors, cognitive decline, and falls. Last visit increased Sinemet from 3 tab QID to 3.5 tab QID then increased rivastigmine from 4.5mg  BID to 6mg  BID.     Patient call from patient's wife yesterday reported the following:   Dr William Russell added more Sinemet and patient is not as sharp cogitatively  Patient is more unsteady  William Russell has to explain things more often  Patient has seen people in bedroom     Interval history:   Accompanied by his wife. He does not notice much improvement with the Sinemet. He continues to have wearing off that manifests as left leg tremor and anxiety.     Sinemet timing: 8a, 1p, 6p, 11p (QHS)  Wakes up and gets out of bed at 8a   Goes to bed at 11p    Medicine kicks in ~30 minutes.  Wearing off occurs 4-4.5 hours after each dose.  No dose failures.   There is some subtle tongue movements since the dose was increased. This is bothersome. Sometimes biting his tongue.   Total OFF time per day in ~2 hours.     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 This is a complicated estate to manage and there is a lot of stress and anxiety associated with this.     Walking is unsteady. He continues to have unprovoked falls. 6-8 times since last visit. [place] of yard work. He does have some orthostatic lightheadedness although improved compared to the past. There is an occasional warning before falls - he feels himself leaning too much. He is an active participant in two different exercise groups focused on Parkinson's disease.     Recently he began having visual hallucinations. He saw a man on the bed one night. Another instance he did not  recognize his wife and kept asking her Where is William Russell. These confusional (?delsion) and hallucinations tend to occur upon waking.     Memory is not good. Not improving on the higher dose of rivastigmine. Denies nausea. He has an appointment with Dr. Artist Russell for neuropsychological testing 09/26/19.     He is using Vesicare for urinary issues. Previously was using Ditropan that was associated with visual hallucinations and switched to Vesicare. Previously tried Trospium and Detrol but these were too expensive. This is managed by his urology provider William Russell. Never tried mirabegron before. He has urinary urgency.     PD Quality Metrics  Falls (Review Each Visit): present     Motor Complications (Review Each Visit): present     Depression (Review Annually): present     Apathy (Review Annually): present     Anxiety (Review Annually): present     Cognitive Impairment (Review Annually): present     Orthostatic Hypotension (Review Annually): denies     Insomnia/Sleep Disturbance (Review Annually): denies     PD Dx Review (Review Annually): yes     PD Med and Surg Option (Review Annually): yes     PD Rehab Options (Review Annually): yes     PD Safety Counseling (Review Annually): yes  Other Symptoms  Gait Freezing: denies     Excessive Sweating: denies     Urinary Incontinence: present  urgency        Dysphagia: denies     Dream Enactment Behavior: present  uses bed rail because he has fallen out of bed three times  Anosmia: present     Constipation: denies  previously this was a problem  Hypophonia: present     Hypomimia: present     Micrographia: present        Med Side Effects  Nausea: denies     Vomiting: denies     Lightheadedness: present  infrequent  Fainting: denies     Daytime Sedation: denies     Dyskinesia: present  tongue  Hallucinations: present     Delusions: denies     Paranoia: denies     Impulse Control Disorder: denies       Motor Fluctuations  Onset of Benefit: 30 minutes  Duration of Benefit:  3.5-4 hours  Wearing Off Prior to Next Dose: 30-60 minutes early    Imaging       Etiol Risk                               Histories:     Past Medical History:   Diagnosis Date   ??? BPH (benign prostatic hyperplasia)    ??? Depression    ??? Hypertension    ??? OSA (obstructive sleep apnea)     uses CPAP   ??? Parkinson's disease (CMS Dx)        Past Surgical History:   Procedure Laterality Date   ??? CARPAL TUNNEL RELEASE Left    ??? CHOLECYSTECTOMY     ??? LUMBAR SPINE SURGERY     ??? SHOULDER SURGERY         Family History   Problem Relation Age of Onset   ??? Cancer Mother    ??? Coronary artery disease Mother    ??? Other Father    ??? Parkinsonism Neg Hx        Social History     Socioeconomic History   ??? Marital status: Married     Spouse name: Not on file   ??? Number of children: Not on file   ??? Years of education: Not on file   ??? Highest education level: Not on file   Occupational History   ??? Not on file   Tobacco Use   ??? Smoking status: Never Smoker   ??? Smokeless tobacco: Never Used   Substance and Sexual Activity   ??? Alcohol use: No   ??? Drug use: No   ??? Sexual activity: Not on file   Other Topics Concern   ??? Caffeine Use Yes   ??? Occupational Exposure No   ??? Exercise Yes   ??? Seat Belt Yes   Social History Narrative   ??? Not on file     Social Determinants of Health     Financial Resource Strain:    ??? Difficulty of Paying Living Expenses:    Food Insecurity:    ??? Worried About Programme researcher, broadcasting/film/video in the Last Year:    ??? Barista in the Last Year:    Transportation Needs:    ??? Freight forwarder (Medical):    ??? Lack of Transportation (Non-Medical):    Physical Activity:    ??? Days of Exercise per Week:    ???  Minutes of Exercise per Session:    Stress:    ??? Feeling of Stress :    Social Connections:    ??? Frequency of Communication with Friends and Family:    ??? Frequency of Social Gatherings with Friends and Family:    ??? Attends Religious Services:    ??? Database administrator or Organizations:    ??? Attends Hospital doctor:    ??? Marital Status:    Intimate Programme researcher, broadcasting/film/video Violence:    ??? Fear of Current or Ex-Partner:    ??? Emotionally Abused:    ??? Physically Abused:    ??? Sexually Abused:        Review of Systems  Refer to HPI for review of systems documentation. All other systems reviewed and negative.     Allergies:   Oxybutynin    Medications:     Current Outpatient Medications:   ???  allopurinol (ZYLOPRIM) 300 MG tablet, Take 300 mg by mouth daily., Disp: , Rfl:   ???  aspirin 325 MG tablet, Take 325 mg by mouth daily., Disp: , Rfl:   ???  atorvastatin (LIPITOR) 20 MG tablet, TAKE 1 TABLET BY MOUTH ONE TIME A DAY AT BEDTIME, Disp: , Rfl: 3  ???  buPROPion XL (WELLBUTRIN XL) 150 MG 24 hr tablet, , Disp: , Rfl:   ???  calcium citrate-vitamin D (CITRACAL+D) 315-200 mg-unit, Take 1 tablet by mouth daily., Disp: , Rfl:   ???  carbidopa-levodopa (SINEMET) 25-100 mg per tablet, Take 3.5 tablets by mouth 4 times a day., Disp: 1260 tablet, Rfl: 3  ???  finasteride (PROSCAR) 5 mg tablet, TAKE ONE TABLET BY MOUTH DAILY, Disp: 30 tablet, Rfl: 8  ???  fluticasone propionate (FLONASE) 50 mcg/actuation nasal spray, Use 1 spray into each nostril at bedtime., Disp: , Rfl:   ???  folic acid (FOLVITE) 400 MCG tablet, Take 400 mcg by mouth daily., Disp: , Rfl:   ???  glucosamine-chondroitin 500-400 mg tablet, Take 1 tablet by mouth daily., Disp: , Rfl:   ???  loratadine (CLARITIN) 10 mg tablet, Take 10 mg by mouth daily as needed for Allergies., Disp: , Rfl:   ???  metoprolol succinate (TOPROL-XL) 25 MG 24 hr tablet, Take 25 mg by mouth daily., Disp: , Rfl:   ???  montelukast (SINGULAIR) 10 mg tablet, , Disp: , Rfl:   ???  multivit-min/FA/lycopen/lutein (CENTRUM SILVER MEN ORAL), Take by mouth., Disp: , Rfl:   ???  mupirocin (BACTROBAN) 2 % ointment, , Disp: , Rfl:   ???  niacin 100 MG tablet, Take 500 mg by mouth daily with breakfast.    , Disp: , Rfl:   ???  omega-3 fatty acids-fish oil 300-1,000 mg capsule, Take 1 capsule by mouth daily., Disp: , Rfl:   ???  omeprazole (PRILOSEC) 20  MG capsule, , Disp: , Rfl:   ???  paroxetine (PAXIL) 30 MG tablet, , Disp: , Rfl:   ???  polyethylene glycol (GLYCOLAX) 17 gram/dose powder, Take 17 g by mouth daily. (Patient taking differently: Take 17 g by mouth if needed.    ), Disp: 510 g, Rfl: 11  ???  rivastigmine tartrate (EXELON) 6 MG capsule, Take 1 capsule (6 mg total) by mouth 2 times a day. Indications: MEMORY, Disp: 180 capsule, Rfl: 3  ???  solifenacin (VESICARE) 5 MG tablet, Take 1 tablet (5 mg total) by mouth daily., Disp: 90 tablet, Rfl: 0  ???  tamsulosin (FLOMAX) 0.4 mg Cap, TAKE ONE CAPSULE BY  MOUTH AT BEDTIME, Disp: 60 capsule, Rfl: 0  ???  saw palmetto 500 MG capsule, Take 500 mg by mouth daily., Disp: , Rfl:     Objective:         Vitals:    09/16/19 1007   BP: 124/60   BP Location: Left arm   Patient Position: Sitting   BP Cuff Size: Large   Pulse: 68   Weight: 193 lb 6.4 oz (87.7 kg)   Height: 5' 8 (1.727 m)       Physical Exam    Neurological Exam    MDS UPDRS part 3 - Motor Examination  MDS UPDRS III Motor Examination  Date medication last taken: 09/16/19  Time medication last taken : 0800  Medication taken : Sinemet 350mg   Medication : ON  General  3.1 Speech: 1  3.2 Facial Expression: 0  Rigidity  3.3a Rigidity- Neck: 0  3.3b Rigidity right upper: 0  3.3c Rigidity left upper: 0  3.3d Rigidity right lower: 0  3.3e Rigidity left lower: 0  Coordination   3.4a Finger tapping, right : 2  3.4b Finger tapping, left: 2  3.5a Hand movement, right: 2  3.5b Hand movement, left: 2  3.6a Pronation-supination of right hand: 3  3.6b Pronation-supination of left hand: 2  3.7a Toe tapping, right : 2  3.7b Toe Tapping, left: 2  3.8a Leg agility, right: 0  3.8b Leg agility, left: 0  Posture and Gait  3.9 Arising from chair : 1  3.10 Gait: 0  3.11 Freezing of gait: 0  3.12 Postural Stability: 1  3.13 Posture: 1  3.14 Body bradykinesia: 0  Tremor  3.15a Postural tremor, right hand: 0  3.15b Postural Tremor, left hand: 0  3.16a Kinetic Tremor of right hand: 0  3.16b  Kinetic tremor of left hand: 0  3.17e Lip/jaw rest tremor: 0  3.17a RUE Rest Tremor Amplitude: 0  3.17b LUE Rest Tremor Amplitude: 0  3.17c RLE Rest Tremor Amplitude: 0  3.17d LLE Rest Tremor Amplitude: 0  3.17e Constancy of Rest tremor: 0  DYSKINESIA IMPACT ON PART III RATINGS  Were dyskinesias (chorea or dystonia) present during examination?: Yes  If yes, did these movements interfere with your ratings?: No  MDS UPDRS 3 Total: 21  Comments: perioral and jaw dyskinesia, bradykinesia driven primarily by tachyhypokinesia      Pertinent chart and records review:  I have reviewed the following labs and imaging studies.    #11/25/17 osh CT head, face, and cspine  Brain: No acute abnormality   Face:   1. ??Nasal tip fracture with slight deviation to the left.   2. ??Questionable fracture versus deviation of the midportion of the nasal septum is displaced to the right. 3 mm.   Cervical spine: degenerative changes, no fracture.       Assessment:     1. Idiopathic Parkinson's disease (CMS Dx)    2. Cognitive decline    3. Motor fluctuations related to medication use in Parkinson's disease (CMS Dx)    4. Dyskinesia      #PD complicated by motor fluctuations and dyskinesia, visual hallucinations. I am reducing the mg per dose and reducing time interval between doses of Sinemet to address motor fluctuations and dyskinesia. The total levodopa dose per day will be reduced which may also reduce frequency of visual hallucinations.     #cognitive decline, visual hallucinations. Reducing levodopa mg/day as described above. I instructed him and his wife to discuss changing urinary urgency medication  Vesicare (anticholinergic) with mirabegron with his urology provider William Russell because mirabegron does not have anticholinergic effects negatively impacting cognition.     Plan:     - Change Sinemet IR 25/100 from 3.5 tablets QID to 3 tablets every 4 hours while awake.   - Discuss replacing Vesicare with mirabegron with William Russell  - Follow up with Shela Nevin 01/01/20 then me 04/13/20         I have spent 40 minutes face to face with this patient with greater than 50% of this time spent in counseling and coordination of care discussing the above issues.  See Assessment and Plan for details.

## 2019-09-16 NOTE — Unmapped (Addendum)
Change the way you take Sinemet. Reduce the interval (time) between doses and reduce the amount (dose) you take.     Sinemet IR 25/100  Take 3 tablets every 4 hours while awake. Take the first dose upon waking. For example, if you wake up at 8am then take 3 tablets at 8a, 12p, 4p, and 8p.     I will talk to your urology provider Sander Radon about trying to replace Vesicare with a different medication called mirabegron (Mybetriq) that does not cause cognitive slowing or memory loss. The main drawback is cost with this medication and this can worsen hypertension.

## 2019-09-26 ENCOUNTER — Ambulatory Visit: Payer: MEDICARE

## 2019-09-30 ENCOUNTER — Ambulatory Visit: Payer: MEDICARE

## 2019-10-03 NOTE — Unmapped (Signed)
Refills called into patients pharmacy

## 2019-10-03 NOTE — Telephone Encounter (Signed)
Pt requesting a refill on Tamsulosin to [facility]. Pt states he has been out of medication for 2 weeks.

## 2019-10-06 MED ORDER — tamsulosin (FLOMAX) 0.4 mg Cap
0.4 | ORAL_CAPSULE | ORAL | 5 refills | Status: AC
Start: 2019-10-06 — End: 2020-06-01

## 2019-10-07 MED ORDER — rivastigmine tartrate (EXELON) 3 MG capsule
3 | ORAL_CAPSULE | ORAL | 3 refills | Status: AC
Start: 2019-10-07 — End: 2020-01-16

## 2019-10-15 MED ORDER — lidocaine HCL (XYLOCAINE) 2 % JelP
2 | Status: AC
Start: 2019-10-15 — End: 2019-10-15

## 2019-10-15 MED ORDER — sodium chloride 0.9 % irrigation
0.9 | Status: AC | PRN
Start: 2019-10-15 — End: 2019-10-15
  Administered 2019-10-15: 14:00:00 200

## 2019-10-15 MED ORDER — ciprofloxacin HCl (CIPRO) tablet 500 mg
500 | ORAL | Status: AC | PRN
Start: 2019-10-15 — End: 2019-10-15
  Administered 2019-10-15: 13:00:00 500 mg via ORAL

## 2019-10-15 MED ORDER — lidocaine HCL (XYLOCAINE) 2 % JelP
2 | Status: AC | PRN
Start: 2019-10-15 — End: 2019-10-15
  Administered 2019-10-15: 13:00:00 10 via TOPICAL

## 2019-10-15 MED FILL — LIDOCAINE 2 % MUCOSAL JELLY IN APPLICATOR: 2 2 % | Qty: 10

## 2019-10-15 MED FILL — CIPROFLOXACIN 500 MG TABLET: 500 500 MG | ORAL | Qty: 1

## 2019-10-15 NOTE — Unmapped (Signed)
Patient back from procedure in stable condition.      Reviewed discharge instructions with the patient. All questions answered. Verbalize understand of instructions. Office to call the patient about scheduling an appt. Patient changed and ready for discharge. Personal items taken. Gait steady. Ambulated to their car.

## 2019-10-15 NOTE — Unmapped (Signed)
Urodynamics Evaluation and Report    Date: 10/15/2019    Urodynamicist:  Sander Radon, CNP    Equipment:  [facility]qu[facility]     Brief History/Indication:    Patient presents for an urodynamic evaluation in order to better characterize his clinical symptoms.    Procedure:   The patient was taken to the Urodynamics suite and a free urine flow was obtained followed by catheterization for residual urine.  A double-lumen urodynamic catheter was introduced to the bladder for measuring bladder pressure, and for filling.  Filling was performed using sterile water at a rate of 30 mL per minute.  EMG patch electrodes were placed perianally for recording activity of the anal sphincter.  A rectal balloon was inserted to record the abdominal pressure.  The entire process was displayed and analyzed using the Laborie Urodynamic machine and software.     FINDINGS:  1. Free Uroflow:  The patient urinated  of urine at a max rate of 14 mL per second with normal  flow.  2. Catheterized (sterile) post-void residual 25mL.  3. Cystometry: The patient was filled to a strong desire to 175 mL. He was filled to a strong desire ONLY d/t phasic DOs with leak. We wanted to ensure we were able to get a pressure flow study. Bladder compliance was normal.  4. Valsalva Leak Point Pressure: negative cough stress test or valsalva leak point pressure.  5. Pressure Flow:  The patient was able to urinate 175 mL with maximum flow rate of 16mL per second and a Pdet of 96 cm H20.  6. EMG:  The EMG showed response to filling, response to straining and brady dyskinesia .      ASSESSMENT:  Clinical Diagnosis:   Medical Hx: constipation, PD, OSA  ??  BPH with LUTS  -Ditropan caused hallucinations. Trospium and detrol too expensive,  insurance did not approve mybetriq, but has previously tried, without benefit  -Previously on Vesicare with benefit but causing memory issues  -Taking Flomax and Proscar. Reports strong urinary stream and complete bladder  emptying.    -Daily urgency with UUI  -Frequency every 3-4 hours  -Nocturia- can sleep for 6 hours now, up x1  -Pads; 1ppd  ??  Right epididymal cyst  -Right testicular swelling continuing, reports this has increased. No pain.    -Denies dysuria, flank pain, fever, chills, urgency  ??  ??  Scrotal US (11/2018):   1. ??Multiple right epididymal cysts, the largest measuring 2.6 cm, unchanged given differences in projection.   2. ??No focal testicular mass   3. ??Stable unilateral right varicocele. No retroperitoneal lymphadenopathy is described on outside abdominal imaging reports from June and July 2019. ??    Renal US (11/2018): Normal kidneys, PVR 25 ml, and mildly enlarged prostate  Creatinine (07/2017): 0.8     Prior VD:   Daytime Frequency: 5  Nocturia: 3  Maximum volume/void: 24 oz  Minimum volume/void: 8 oz  Average volume/void: 15oz  Total voided volume/day:73 oz  Total volume voided/night:39 oz  Total fluid intake/24 hours: 103 oz   Number of UUI episodes:1   Number of SUI episodes:0  Number of pads changed:1    Urodynamic Diagnosis:   Phasic DO with leak  Bladder Outlet Obstruction  Huston Foley Dyskinesia     Disposition:   RTC for in depth discussion of UDS findings and cystoscopy with Dr.Mckenlee Mangham on 12/02/19.   Plan: If cysto shows anatomical bladder outlet obstruction by the prostate consider TURP/HoLEP. If no anatomical bladder outlet  obstruction then consider SNS vs CIC with botox for OAB dry.     Procedure findings were discussed and agreed with CNP as documented above.  Gwenlyn Perking Farrah Skoda

## 2019-10-15 NOTE — Unmapped (Signed)
INTRA-OP POST BRIEFING NOTE: William Russell      Specimens: none    Prior to leaving the room: Nurse confirmed name of procedure and addresses any equipment issues? Nurse confirmed wound class. Nurse to nurse practioner: What are key concerns for recovery and management of the patient?  Yes      Blood products stored at appropriate temperatures prior to return to blood bank (if applicable)? N/A      Patient identification band secured on patient prior to transfer out of the operating room? Yes    Temporary devices implanted for the duration of the surgery removed and evaluated for intactness and completeness prior to closure? N/A      Other Comments: patient has follow-up/cysto w/Dr.Mahdy 12/02/19 @ 0900    Signed: Marta Antu    Date: 10/15/2019    Time: 9:50 AM

## 2019-10-15 NOTE — Unmapped (Signed)
Urodynamic Testing  What is urodynamic testing?    Urodynamic tests are done to determine how well your lower urinary tract is working. The lower urinary tract includes your bladder and the part of your body that drains urine from the bladder (urethra).  When your kidneys filter your blood, urine is stored in your bladder until you feel the urge to urinate. Urination requires coordination between the nerves and muscles of your bladder and urethra. When your lower urinary tract is working well, you should be able to:  ?? Start urinating when your bladder is full.  ?? Empty your bladder completely.  ?? Control the flow of your urine.  Why do I need urodynamic testing?  You may need urodynamic testing to help find the cause of any of these problems:  ?? Leaking urine (incontinence).  ?? Problems starting or stopping your urine flow.  ?? Frequent or painful urination.  ?? Frequent urinary tract infections.  ?? Being unable to empty your bladder completely.  ?? Having strong urges to pass urine (urgency).  ?? Having a weak flow of urine.  How do I prepare for the tests?  ?? Ask your health care provider about changing or stopping your regular medicines. This is especially important if you are taking diabetes medicines or blood thinners.  ?? You may be asked to avoid urinating before coming to the test so that you arrive with a full bladder.  ?? Tell a health care provider about:  ? Any allergies you have.  ? All medicines you are taking, including vitamins, herbs, eye drops, creams, and over-the-counter medicines.  ? Whether you are pregnant or may be pregnant.  What are the risks of this testing?  Generally, these tests are safe. However, some of the tests have risks, including:  ?? Discomfort.  ?? Frequent urge to urinate.  ?? Bleeding.  ?? Infection.  ?? Allergic reactions to medicines or dyes (contrast material).  How is urodynamic testing done?  You may have various urodynamic tests. The tests may be done separately or may all be  done during one testing visit. You may be given an antibiotic medicine before or after testing to help prevent infection.  The types of tests that may be done include:  Uroflowmetry  This test measures how much urine you pass and how long it takes to pass.  ?? You will urinate into a certain type of toilet or device (flowmeter).  ?? The device will measure the volume and the time of your urine flow.  ?? These measurements will be sent to a computer that creates a graph of your urine flow.  Postvoid residual measurement  This test measures how much urine is left in your bladder after you urinate.  ?? The test may be done with ultrasound. In this method, sound waves and a computer will be used to create an image of your bladder.  ?? The test can also be done by inserting a thin, flexible tube (catheter) into your bladder after you urinate. The remaining urine will be removed through the catheter so it can be measured.  ?? Remaining urine will be measured in milliliters (mL). If you have more than 100 mL left in your bladder after you urinate, your bladder is not emptying as it should.  Cystometric testing  This test uses a type of bladder catheter that can measure pressure.  ?? You may be given a medicine to numb the area (local anesthetic).  ?? The area around the   opening of your urethra will be cleaned.  ?? A urinary catheter will be passed through your urethra into your bladder and used to empty your bladder completely.  ?? Then a measuring catheter will be placed, and your bladder will be filled with warm, germ-free (sterile) water.  ?? Pressure measurements will be taken:  ? As your bladder fills.  ? When you feel the need to urinate.  ? As your bladder is emptied.  ?? You may be asked to cough or bear down to check for leakage.  ?? In some cases, your bladder may be filled with a material that shows up on X-rays (contrast material) so that X-ray pictures can be taken during the test.  Electromyogram  This test measures the  electrical activity of the nerves and muscles of your bladder and the opening of your urethra.  ?? Sticky patches (electrodes) will be placed near your rectum and urethra to measure electrical activity.  ?? The measurements will show how well your nerves are communicating with your muscles.  What happens after the testing?  ?? You should be able to go home right away and do your usual activities.  ?? You may be told to drink a glass of water every 30 minutes for the first 2 hours after testing.  ?? Taking a warm bath or using warm, wet cloths (warm compresses) may relieve any discomfort near your urethra.  ?? Contact your health care provider if you have:  ? Pain.  ? Blood in your urine.  ? Chills.  ? Fever.  What do the results mean?  Talk with your health care provider about what your results mean. Some common causes for abnormal results from urodynamic tests include:  ?? Enlarged prostate in men.  ?? Overactive bladder.  ?? Urinary tract infection.  ?? Nervous system diseases.  ?? Spinal cord damage.  Questions to ask your health care provider  Ask your health care provider, or the department that is doing the test:  ?? When will my results be ready?  ?? How will I get my results?  ?? What are my treatment options?  ?? What other tests do I need?  ?? What are my next steps?  Summary  ?? Urodynamic tests are done to determine how well your lower urinary tract is working. The lower urinary tract includes your bladder and urethra.  ?? You may need urodynamic testing to help find the cause of various problems with urination, such as leaking urine (incontinence) or problems starting or stopping your urine flow.  ?? You may have various urodynamic tests. The tests may be done separately or may all be done during one testing visit.  ?? Talk with your health care provider about what your results mean.  ?? Contact your health care provider if you have pain, chills, a fever, or blood in your urine.  This information is not intended to replace  advice given to you by your health care provider. Make sure you discuss any questions you have with your health care provider.  Document Revised: 05/21/2018 Document Reviewed: 12/04/2016  Elsevier Patient Education ?? 2021 [employer]

## 2019-10-17 ENCOUNTER — Ambulatory Visit: Payer: MEDICARE | Attending: Family

## 2019-10-17 ENCOUNTER — Ambulatory Visit: Payer: MEDICARE

## 2019-10-29 MED ORDER — solifenacin (VESICARE) 5 MG tablet
5 | ORAL_TABLET | ORAL | 2 refills | Status: AC
Start: 2019-10-29 — End: 2020-04-05

## 2019-12-01 IMAGING — MR MR 3D RECON AT SCANNER
18 of 20 series · 42 of 48 positions shown · IV contrast (gadavist)
Comparison: MRI of the abdomen 11/23/2015.

CLINICAL DATA: 82-year-old male with history of pancreatic lesion.
Follow-up study.

EXAM:
MRI ABDOMEN WITHOUT AND WITH CONTRAST (INCLUDING MRCP)
TECHNIQUE: Multiplanar multisequence MR imaging of the abdomen was performed
both before and after the administration of intravenous contrast.
Heavily T2-weighted images of the biliary and pancreatic ducts were
obtained, and three-dimensional MRCP images were rendered by post
processing.
CONTRAST:  7 mL of Gadavist.

[Series 2: bSSFP · coronal · 6.0mm · 0.74mm/px · 1 of 34 slices shown]
[im 1/34]
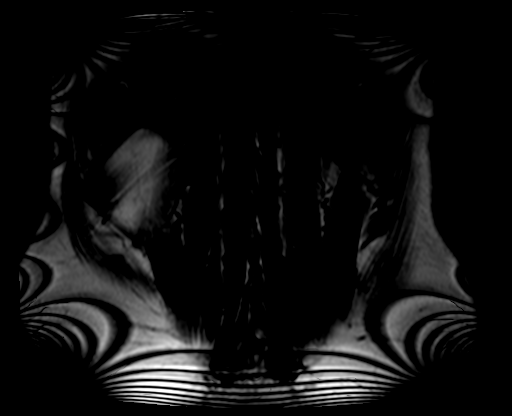

[Series 3: T2 fat-sat · axial · 6.0mm · 1.48mm/px · 1 of 30 slices shown]
[im 1/30]
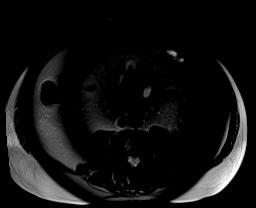

[Series 4: T1 · axial · 6.0mm · 0.78mm/px · z∈[-74,+135]mm · 3 of 60 slices shown]
[im 1/60]
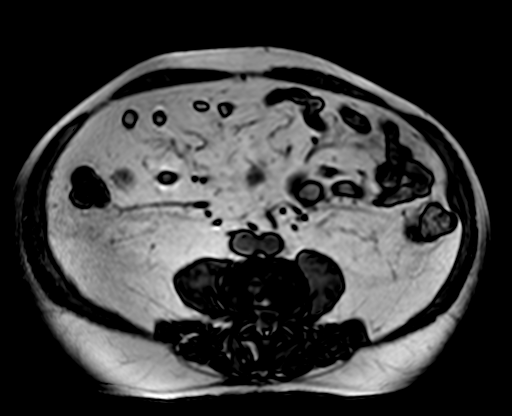
[im 30/60]
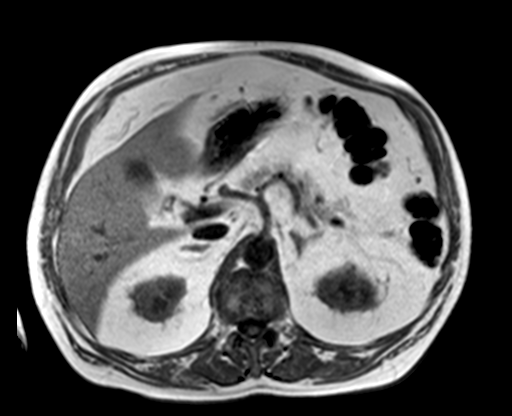
[im 60/60]
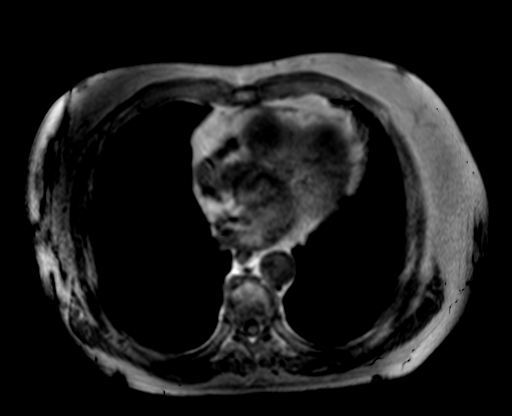

[Series 5: axial ssfse / · axial · 6.0mm · 1.19mm/px · 1 of 30 slices shown]
[im 1/30]
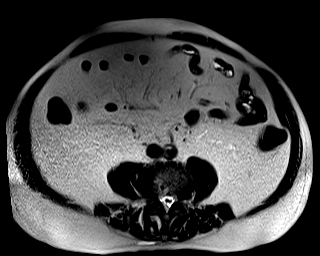

[Series 6: axial dynamic pre · axial · non-contrast · 4.0mm · 1.19mm/px · z∈[-95,+157]mm · 3 of 64 slices shown]
[im 1/64]
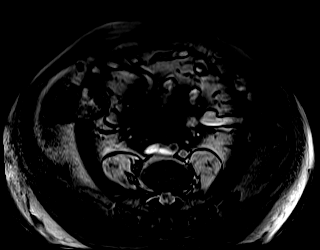
[im 32/64]
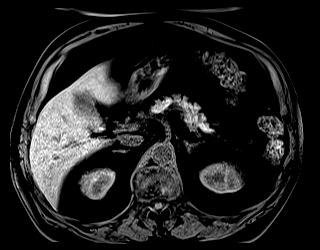
[im 64/64]
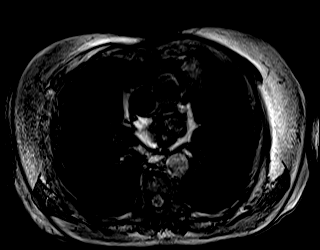

[Series 7: MRCP · coronal · 50.0mm · 0.78mm/px · 1 of 3 slices shown]
[im 1/3]
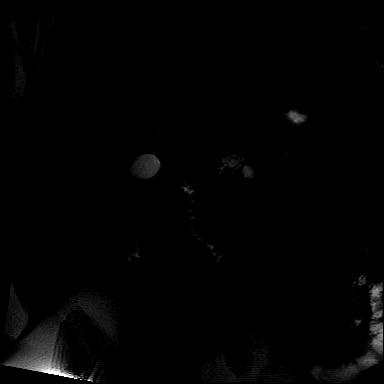

[Series 8: cor thins · coronal · 3.5mm · 0.94mm/px · 1 of 13 slices shown]
[im 1/13]
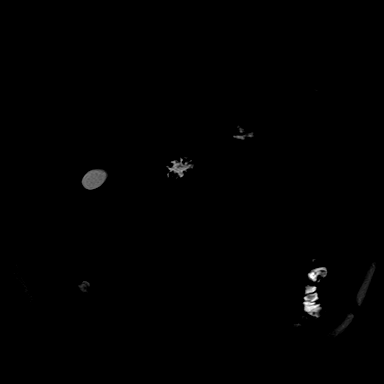

[Series 9: DWI · axial · 6.0mm · 2.00mm/px · z∈[-74,+135]mm · 4 of 90 slices shown]
[im 1/90]
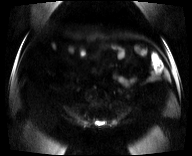
[im 30/90]
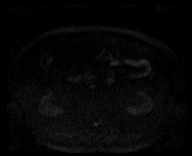
[im 60/90]
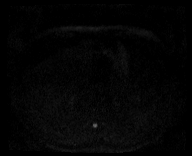
[im 90/90]
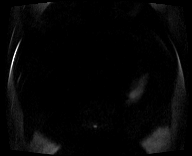

[Series 10: ax dwi_adc · axial · 6.0mm · 2.00mm/px · 1 of 30 slices shown]
[im 1/30]
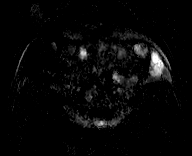

[Series 12: T2 · coronal · 1.5mm · 0.99mm/px · 2 of 44 slices shown]
[im 1/44]
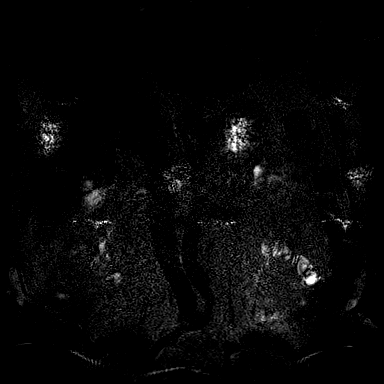
[im 44/44]
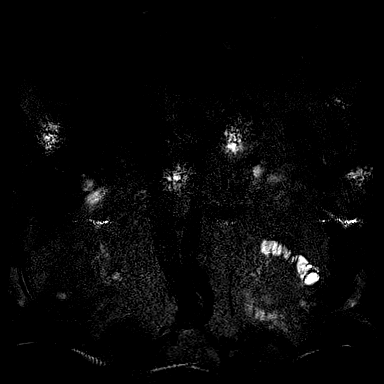

[Series 14: axial dynamic post · axial · 4.0mm · 1.19mm/px · z∈[-95,+157]mm · 3 of 64 slices shown (1 of 6)]
[im 1/64]
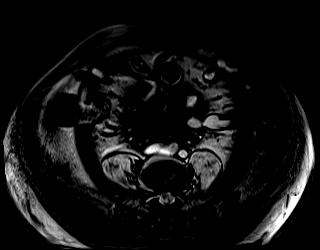
[im 32/64]
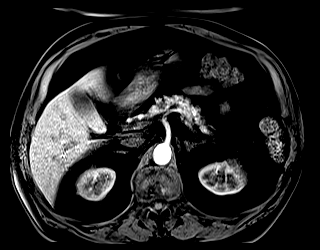
[im 64/64]
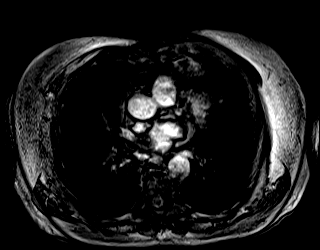

[Series 15: axial dynamic post · axial · 4.0mm · 1.19mm/px · z∈[-95,+157]mm · 3 of 64 slices shown (2 of 6)]
[im 1/64]
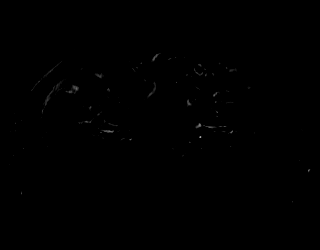
[im 32/64]
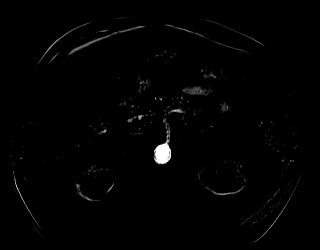
[im 64/64]
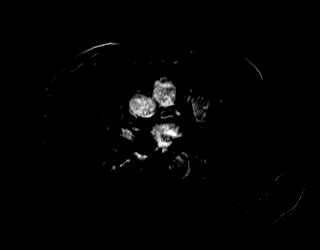

[Series 16: axial dynamic post · axial · 4.0mm · 1.19mm/px · z∈[-95,+157]mm · 3 of 64 slices shown (3 of 6)]
[im 1/64]
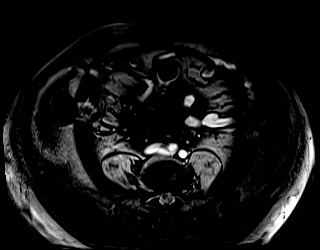
[im 32/64]
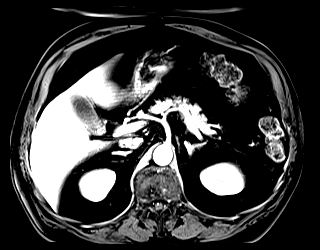
[im 64/64]
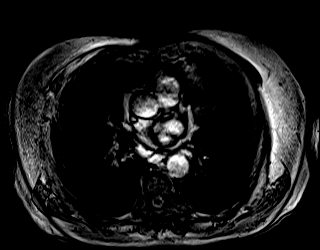

[Series 17: axial dynamic post · axial · 4.0mm · 1.19mm/px · z∈[-95,+157]mm · 3 of 64 slices shown (4 of 6)]
[im 1/64]
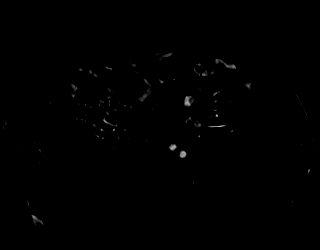
[im 32/64]
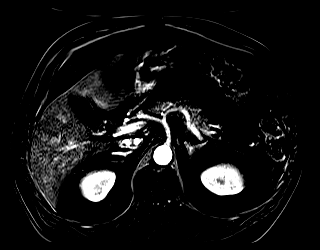
[im 64/64]
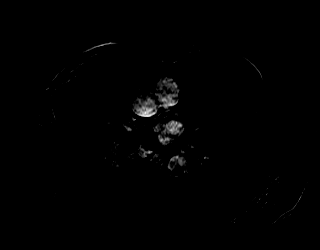

[Series 18: axial dynamic post · axial · 4.0mm · 1.19mm/px · z∈[-95,+157]mm · 3 of 64 slices shown (5 of 6)]
[im 1/64]
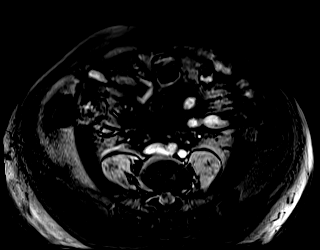
[im 32/64]
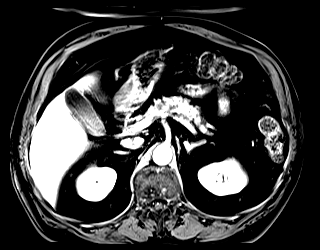
[im 64/64]
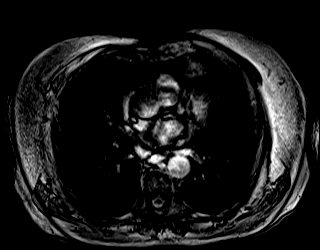

[Series 19: axial dynamic post · axial · 4.0mm · 1.19mm/px · z∈[-95,+157]mm · 3 of 64 slices shown (6 of 6)]
[im 1/64]
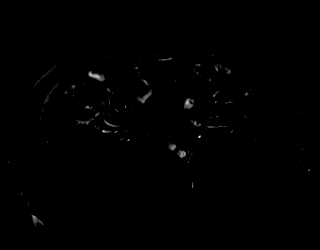
[im 32/64]
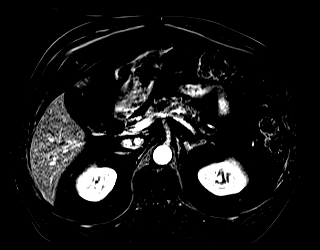
[im 64/64]
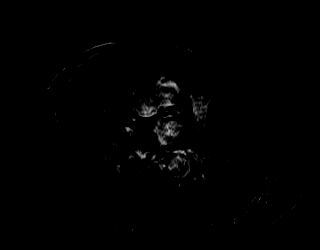

[Series 21: axial dynamic delayed · axial · 4.0mm · 1.19mm/px · z∈[-95,+157]mm · 3 of 64 slices shown]
[im 1/64]
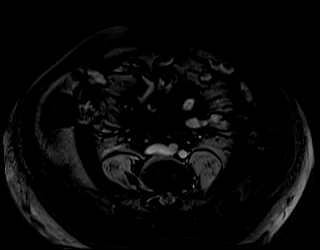
[im 32/64]
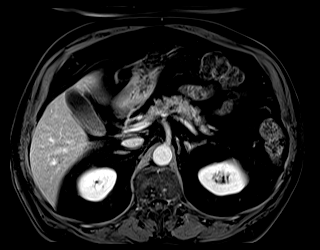
[im 64/64]
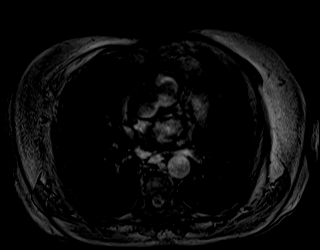

[Series 22: axial dynamic delayed_sub · axial · 4.0mm · 1.19mm/px · z∈[-95,+157]mm · 3 of 64 slices shown]
[im 1/64]
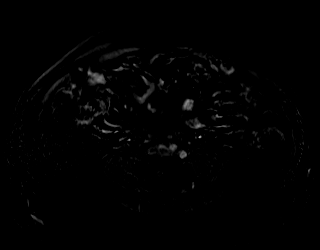
[im 32/64]
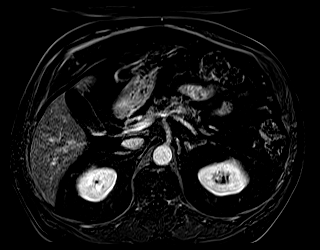
[im 64/64]
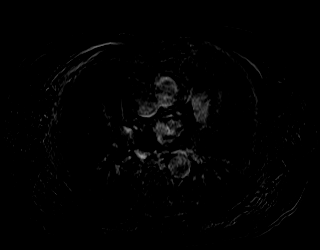

[42 of 48 positions shown; findings below may reference images not displayed]

FINDINGS: Lower chest: Unremarkable.

Hepatobiliary: No suspicious cystic or solid hepatic lesions. No
intra or extrahepatic biliary ductal dilatation noted on MRCP
images. Common bile duct measures 3 mm in the porta hepatis. No
filling defect in the common bile duct to suggest
choledocholithiasis.

Pancreas: Multiple well-defined T1 hypointense, T2 hyperintense,
nonenhancing pancreatic lesions are again noted, largest of which is
in the proximal pancreatic body (axial image 17 of series 5)
measuring 9 mm, stable compared to the prior examination. The other
lesions also appear stable compared to the prior study. No new
suspicious appearing pancreatic lesions. No pancreatic ductal
dilatation noted on MRCP images. No peripancreatic fluid or
inflammatory changes.

Spleen:  Unremarkable.

Adrenals/Urinary Tract: Bilateral kidneys and bilateral adrenal
glands are normal in appearance. There is no hydroureteronephrosis
in the visualized portions of the abdomen. Bilateral adrenal glands
are normal in appearance.

Stomach/Bowel: Visualized portions are unremarkable.

Vascular/Lymphatic: Aortic atherosclerosis, without evidence of
aneurysm in the abdominal vasculature. No lymphadenopathy noted in
the abdomen.

Other: No significant volume of ascites noted in the visualized
portions of the peritoneal cavity.

Musculoskeletal: No aggressive appearing osseous lesions are noted
in the visualized portions of the skeleton.
IMPRESSION: 1. Previously noted simple cystic appearing lesions in the body and
tail of the pancreas are stable in size compared to prior studies.
The largest of these measures only 9 mm in the proximal body of the
pancreas. Repeat abdominal MRI with and without IV gadolinium with
MRCP is recommended in 2 years to ensure continued stability. This
recommendation follows ACR consensus guidelines: Management of
Incidental Pancreatic Cysts: A White Paper of the ACR Incidental
Findings Committee. [HOSPITAL] 2199;[DATE].
2. Aortic atherosclerosis.

## 2019-12-02 ENCOUNTER — Ambulatory Visit: Admit: 2019-12-02 | Payer: MEDICARE

## 2019-12-02 DIAGNOSIS — N401 Enlarged prostate with lower urinary tract symptoms: Secondary | ICD-10-CM

## 2019-12-02 MED ORDER — ciprofloxacin HCl (CIPRO) tablet 500 mg
500 | Freq: Once | ORAL | Status: AC
Start: 2019-12-02 — End: 2019-12-02
  Administered 2019-12-02: 17:00:00 500 mg via ORAL

## 2019-12-02 MED ORDER — lidocaine HCL (XYLOCAINE) 2 % JelP 10 mL
2 | Freq: Once | Status: AC
Start: 2019-12-02 — End: 2019-12-02
  Administered 2019-12-02: 17:00:00 10 mL

## 2019-12-02 NOTE — Unmapped (Signed)
Cystoscopy:  URO 2

## 2019-12-02 NOTE — Unmapped (Signed)
Chief Complaint   Patient presents with   ??? Procedure     Cystoscopy     Procedure Performed:  Cystoscopy       Physician:     Yetta Barre, MD,PhD, MBA      Assistant: Rozanna Box    Procedure in Detail:     The patient signed an informed consent.  Prophylactic antibiotics were administered.  The patient was placed in the supine position.  Genitalia was prepped and draped in the usual sterile fashion.  Intraurethral anesthetic was administered.   Flexible cystoscope was  Introduced without difficulty.  Enlarged median and two lateral prostate lobe .  There is coarse trabeculation.  No  bladder diverticulum noticed.   Urethral stricture was absent. Normal ureteral orifices.  No bladder turmor noted.   Flexible cystoscope was removed  without difficulty.        History of Present Illness  84y/o male with history of PD (03/2017), OSA, constipation, depression. Here for follow up   ??  BPH with LUTS  -Ditropan caused hallucinations. Trospium and detrol too expensive,  insurance did not approve mybetriq, but has previously tried, without benefit  -Taking Vesicare with benefit but causing memory issues  -Taking Flomax and Proscar. Reports strong urinary stream and complete bladder emptying.    -Daily urgency no leaks   -Frequency every 3-4 hours  -Nocturia- can sleep for 6 hours now, up x1  ??  Right epididymal cyst  -Right testicular swelling continuing, reports this has increased. No pain.    -Denies dysuria, flank pain, fever, chills, urgency       Review of Systems   Constitutional: Negative for chills, diaphoresis, fatigue and fever.   Genitourinary: Positive for frequency, nocturia and urgency. Negative for dysuria, flank pain and hematuria.   Musculoskeletal: Positive for arthralgias, back pain and myalgias.       Allergies  Oxybutynin    Medications  Outpatient Encounter Medications as of 12/02/2019   Medication Sig Dispense Refill   ??? allopurinol (ZYLOPRIM) 300 MG tablet Take 300 mg by mouth daily.     ???  aspirin 325 MG tablet Take 325 mg by mouth daily.     ??? atorvastatin (LIPITOR) 20 MG tablet TAKE 1 TABLET BY MOUTH ONE TIME A DAY AT BEDTIME  3   ??? buPROPion XL (WELLBUTRIN XL) 150 MG 24 hr tablet      ??? calcium citrate-vitamin D (CITRACAL+D) 315-200 mg-unit Take 1 tablet by mouth daily.     ??? carbidopa-levodopa (SINEMET) 25-100 mg per tablet Take 3.5 tablets by mouth 4 times a day. 1260 tablet 3   ??? finasteride (PROSCAR) 5 mg tablet TAKE ONE TABLET BY MOUTH DAILY 30 tablet 8   ??? fluticasone propionate (FLONASE) 50 mcg/actuation nasal spray Use 1 spray into each nostril at bedtime.     ??? folic acid (FOLVITE) 400 MCG tablet Take 400 mcg by mouth daily.     ??? glucosamine-chondroitin 500-400 mg tablet Take 1 tablet by mouth daily.     ??? loratadine (CLARITIN) 10 mg tablet Take 10 mg by mouth daily as needed for Allergies.     ??? metoprolol succinate (TOPROL-XL) 25 MG 24 hr tablet Take 25 mg by mouth daily.     ??? montelukast (SINGULAIR) 10 mg tablet      ??? multivit-min/FA/lycopen/lutein (CENTRUM SILVER MEN ORAL) Take by mouth.     ??? mupirocin (BACTROBAN) 2 % ointment      ??? niacin 100 MG tablet Take  500 mg by mouth daily with breakfast.            ??? omega-3 fatty acids-fish oil 300-1,000 mg capsule Take 1 capsule by mouth daily.     ??? omeprazole (PRILOSEC) 20 MG capsule      ??? paroxetine (PAXIL) 30 MG tablet      ??? polyethylene glycol (GLYCOLAX) 17 gram/dose powder Take 17 g by mouth daily. (Patient taking differently: Take 17 g by mouth if needed.       ) 510 g 11   ??? rivastigmine tartrate (EXELON) 3 MG capsule TAKE ONE CAPSULE BY MOUTH TWICE A DAY 180 capsule 3   ??? rivastigmine tartrate (EXELON) 6 MG capsule Take 1 capsule (6 mg total) by mouth 2 times a day. Indications: MEMORY 180 capsule 3   ??? tamsulosin (FLOMAX) 0.4 mg Cap TAKE ONE CAPSULE BY MOUTH EVERY NIGHT AT BEDTIME 60 capsule 5   ??? saw palmetto 500 MG capsule Take 500 mg by mouth daily.     ??? solifenacin (VESICARE) 5 MG tablet TAKE ONE TABLET BY MOUTH DAILY 90  tablet 2   ??? [DISCONTINUED] rivastigmine tartrate (EXELON) 3 MG capsule Take 1 capsule (3 mg total) by mouth 2 times a day. 180 capsule 3     No facility-administered encounter medications on file as of 12/02/2019.        Histories  He has a past medical history of BPH (benign prostatic hyperplasia), Depression, Hypertension, OSA (obstructive sleep apnea), and Parkinson's disease (CMS Dx).    He has a past surgical history that includes Lumbar spine surgery; Cholecystectomy; Carpal tunnel release (Left); Shoulder surgery (Bilateral); OTHER SURGICAL HISTORY (Left); and cystoscopy urodynamics (N/A, 10/15/2019).    His family history includes Cancer in his mother; Coronary artery disease in his mother; Other in his father.    He reports that he has never smoked. He has never used smokeless tobacco. He reports that he does not drink alcohol and does not use drugs.    The following portions of the patient's history were reviewed and updated as appropriate: allergies, current medications, past family history, past medical history, past social history, past surgical history and problem list.    Blood pressure 112/64, pulse 71, height 5' 8 (1.727 m), weight 195 lb (88.5 kg), SpO2 96 %.  Physical Exam         Assessment  Medical Hx: constipation, PD, OSA  ??  BPH with LUTS  -Ditropan caused hallucinations. Trospium??and detrol??too expensive, ??insurance did not approve mybetriq, but has previously tried, without benefit  -Previously on Vesicare with benefit??but??causing memory issues  -Taking Flomax and Proscar. Reports strong urinary stream and complete bladder emptying.????  -Daily urgency with UUI  -Frequency every 3-4 hours  -Nocturia- can sleep for 6 hours now, up x1  -Pads; 1ppd  ??  Right epididymal cyst  -Right testicular swelling continuing, reports this has increased.??No pain. ??  -Denies dysuria, flank pain, fever, chills, urgency    Urodynamic Diagnosis on 9/21  Phasic DO with leak  Bladder Outlet Obstruction  Huston Foley  Dyskinesia    Cystoscopy today showed enlarged median and two lateral prostate lobes with bladder trabeculation    Plan  Discussed the complexity if his clinical picture due to bladder outlet obstruction and PD  Discussed TURP with the patient. Patient understands that leakage might get worse after TURP.  Complications of TURP include infection,bleeding and incontinence   Discussed will assess for OAB symptoms after TURP and possible treatment including Botox  Will schedule patient for TURP       Medical Decision Making  The following items were considered in medical decision making:  Review / order clinical lab tests  Review / order radiology tests  Review / order other diagnostic tests/interventions  Reviewed outside records     I was present for the entire procedure and findings were discussed and agreed with fellow as documented above.  I have spent 20 minutes face to face with this patient with greater than 50% of this time spent in counseling and coordination of care discussing this was in addition to the procedure time.  I have personally taken the consent from the patient. All indications, benefits, alternatives, complications have been explained to the patient and He is willing to proceed.  Complications include but not limited to anaesthesia related, pain, infection, bleeding, failure, retrograde ejaculation and urinary incontinence.  Discussed Urgency Urinary Incontinence may worsen after procedure and further Tx may be needed for OAB wet  Laconda Basich E Lillyanna Glandon

## 2019-12-18 NOTE — Unmapped (Signed)
Spoke to patient and reviewed CPC visit date, time and location.    Interpreter needed (y/n): no  Pacemaker or defibrillator (y/n): no  [facility] pt (y/n): no  C[facility]Pulm at [facility] no  Cards/Pulm outside [facility]: [facility]  Dr Kirtland Bouchard. Beckey Downing [phone])  Dialysis (y/n): no  Port (y/n): no  Nursing home (y/n): no  Medical transport (y/n): no  Tracheostomy (y/n): no

## 2019-12-29 NOTE — Unmapped (Signed)
Patient is requesting a refill on their medication: rivastigmine tartrate (EXELON) 6 MG capsule    Medication Last Filled: 06/05/19      Last Office Visit: 09/16/2019 w/ Dr Rema Jasmine      Future Office Visit: 01/01/2020 w/ Boneta Lucks       90 day supply: yes    * Spoke with pharmacy on file and pharmacist Jasmine December stated that they have refills on hold in their system. Another request is not needed.        Patients Pharmacy:   [facility] [address], [place] - [address] AT [facility]  [address]  [place] [address]  Phone: [phone] Fax: [phone]

## 2019-12-30 ENCOUNTER — Ambulatory Visit: Payer: MEDICARE | Attending: Adult Health

## 2020-01-01 ENCOUNTER — Ambulatory Visit: Admit: 2020-01-01 | Payer: MEDICARE | Attending: Adult Health

## 2020-01-01 DIAGNOSIS — R202 Paresthesia of skin: Secondary | ICD-10-CM

## 2020-01-01 NOTE — Unmapped (Signed)
Subjective:      Patient ID: William Russell is a 84 y.o. male.    Movement Disorders HPI       Handedness: Right  Chief Complaint: Parkinson's  Patient accompanied by and additional history obtained from: wife  Age at onset of symptoms: 8  Duration of symptoms: 5 years    Follow up for Parkinson's disease with dream enactment behaviors, cognitive decline, and falls. He last saw Dr. Rema Jasmine August and we changed Sinemet IR 25/100 from 3.5 tablets QID to 3 tablets every 4 hours while awake to help with wearing off and improve hallucinations. He denies wearing off. Tremor re-emerges if he is late taking a dose. He reports stress or pressure will exacerbate the tremor. He denies any new falls. He continues doing PT and he found it helpful.     Mood - He says he is easily irritated. He says he yells at his wife to much. Wife says stress is a trigger. 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 He thinks he will have to downsize to a smaller house and this makes him sad.      Cognitively:  He is taking rivastigmine 6 mg BID, although he reports this was decreased to 3 mg bid (unsure why).  His wife has to explain thing more often. He has wording finding difficulty. He forgets peoples names.     Hallucinations are seldom.     He rarely has lightheadedness.     He is no longer on Vesicare. He takes Flomax and Proscar.  He is scheduled for a cystoscopy in March. He continues following with Dr. Ree Shay and Sander Radon, CNP    Wife reports tongue dyskinesia.  He thinks having dry mouth contributes. He is chewing gum and that helps.     He reports numbness and tingling in right thumb and first two fingers. He reports having an injection in his wrist for carpel tunnel and after the injection the tips of the fingers started burning, now progressed to numbness and tingling. He denies weakness.     PD Quality  Metrics  Falls (Review Each Visit): present     Motor Complications (Review Each Visit): present  improved  Depression (Review Annually): present     Apathy (Review Annually): present     Anxiety (Review Annually): present     Cognitive Impairment (Review Annually): present     Orthostatic Hypotension (Review Annually): denies     Insomnia/Sleep Disturbance (Review Annually): denies     PD Dx Review (Review Annually): yes     PD Med and Surg Option (Review Annually): yes     PD Rehab Options (Review Annually): yes     PD Safety Counseling (Review Annually): yes       Other Symptoms  Gait Freezing: denies     Excessive Sweating: denies     Urinary Incontinence: present  urgency        Dysphagia: denies     Dream Enactment Behavior: present  uses bed rail because he has fallen out of bed three times  Anosmia: present     Constipation: denies  previously this was a problem  Hypophonia: present     Hypomimia: present     Micrographia: present        Med Side Effects  Nausea: denies  Vomiting: denies     Lightheadedness: present  rarely  Fainting: denies     Daytime Sedation: denies     Dyskinesia: present  tongue  Hallucinations: present  rarely  Delusions: denies     Paranoia: denies     Impulse Control Disorder: denies       Motor Fluctuations  Onset of Benefit: 20-30 minutes  Duration of Benefit: 4hours  Wearing Off Prior to Next Dose: if late taking a dose    Imaging          Histories:     He has a past medical history of BPH (benign prostatic hyperplasia), Depression, Hypertension, OSA (obstructive sleep apnea), and Parkinson's disease (CMS Dx).    He has a past surgical history that includes Lumbar spine surgery; Cholecystectomy; Carpal tunnel release (Left); Shoulder surgery (Bilateral); OTHER SURGICAL HISTORY (Left); and cystoscopy urodynamics (N/A, 10/15/2019).    His family history includes Cancer in his mother; Coronary artery disease in his mother; Other in his father.    He reports that he has never  smoked. He has never used smokeless tobacco. He reports that he does not drink alcohol and does not use drugs.    Review of Systems  Refer to HPI for review of systems documentation.  ROS was otherwise non-contributory.     Allergies:   Oxybutynin    Medications:     Outpatient Encounter Medications as of 01/01/2020   Medication Sig Dispense Refill   ??? allopurinol (ZYLOPRIM) 300 MG tablet Take 300 mg by mouth daily.     ??? aspirin 325 MG tablet Take 325 mg by mouth daily.     ??? atorvastatin (LIPITOR) 20 MG tablet TAKE 1 TABLET BY MOUTH ONE TIME A DAY AT BEDTIME  3   ??? buPROPion XL (WELLBUTRIN XL) 150 MG 24 hr tablet      ??? calcium citrate-vitamin D (CITRACAL+D) 315-200 mg-unit Take 1 tablet by mouth daily.     ??? carbidopa-levodopa (SINEMET) 25-100 mg per tablet Take 3.5 tablets by mouth 4 times a day. (Patient taking differently: Take 3 tablets by mouth 4 times a day.       ) 1260 tablet 3   ??? finasteride (PROSCAR) 5 mg tablet TAKE ONE TABLET BY MOUTH DAILY 30 tablet 8   ??? fluticasone propionate (FLONASE) 50 mcg/actuation nasal spray Use 1 spray into each nostril at bedtime.     ??? folic acid (FOLVITE) 400 MCG tablet Take 400 mcg by mouth daily.     ??? glucosamine-chondroitin 500-400 mg tablet Take 1 tablet by mouth daily.     ??? loratadine (CLARITIN) 10 mg tablet Take 10 mg by mouth daily as needed for Allergies.     ??? metoprolol succinate (TOPROL-XL) 25 MG 24 hr tablet Take 25 mg by mouth daily.     ??? montelukast (SINGULAIR) 10 mg tablet      ??? multivit-min/FA/lycopen/lutein (CENTRUM SILVER MEN ORAL) Take by mouth.     ??? mupirocin (BACTROBAN) 2 % ointment      ??? niacin 100 MG tablet Take 500 mg by mouth daily with breakfast.            ??? omega-3 fatty acids-fish oil 300-1,000 mg capsule Take 1 capsule by mouth daily.     ??? omeprazole (PRILOSEC) 20 MG capsule      ??? paroxetine (PAXIL) 30 MG tablet      ??? polyethylene glycol (GLYCOLAX) 17 gram/dose powder Take 17 g by mouth daily. (Patient taking differently:  Take 17 g  by mouth if needed.       ) 510 g 11   ??? rivastigmine tartrate (EXELON) 3 MG capsule TAKE ONE CAPSULE BY MOUTH TWICE A DAY 180 capsule 3   ??? tamsulosin (FLOMAX) 0.4 mg Cap TAKE ONE CAPSULE BY MOUTH EVERY NIGHT AT BEDTIME 60 capsule 5   ??? rivastigmine tartrate (EXELON) 6 MG capsule Take 1 capsule (6 mg total) by mouth 2 times a day. Indications: MEMORY 180 capsule 3   ??? saw palmetto 500 MG capsule Take 500 mg by mouth daily.     ??? solifenacin (VESICARE) 5 MG tablet TAKE ONE TABLET BY MOUTH DAILY 90 tablet 2   ??? [DISCONTINUED] rivastigmine tartrate (EXELON) 3 MG capsule Take 1 capsule (3 mg total) by mouth 2 times a day. 180 capsule 3     No facility-administered encounter medications on file as of 01/01/2020.       Objective:         Vitals:    01/01/20 0915   BP: 104/64   Pulse: 71   SpO2: 96%   Weight: 193 lb (87.5 kg)   Height: 5' 8 (1.727 m)       MDS UPDRS part 3 - Motor Examination  MDS UPDRS III Motor Examination  Date medication last taken: 01/01/20  Time medication last taken : 0730  Medication taken : Sinemet 300mg   Medication : ON  General  3.1 Speech: 1  3.2 Facial Expression: 0  Rigidity  3.3a Rigidity- Neck: 0  3.3b Rigidity right upper: 0  3.3c Rigidity left upper: 0  3.3d Rigidity right lower: 0  3.3e Rigidity left lower: 0  Coordination   3.4a Finger tapping, right : 2  3.4b Finger tapping, left: 2  3.5a Hand movement, right: 2  3.5b Hand movement, left: 2  3.6a Pronation-supination of right hand: 3  3.6b Pronation-supination of left hand: 2  3.7a Toe tapping, right : 2  3.7b Toe Tapping, left: 2  3.8a Leg agility, right: 0  3.8b Leg agility, left: 0  Posture and Gait  3.9 Arising from chair : 1  3.10 Gait: 0  3.11 Freezing of gait: 0  3.12 Postural Stability: 1  3.13 Posture: 1  3.14 Body bradykinesia: 0  Tremor  3.15a Postural tremor, right hand: 0  3.15b Postural Tremor, left hand: 0  3.16a Kinetic Tremor of right hand: 0  3.16b Kinetic tremor of left hand: 0  3.17e Lip/jaw rest tremor: 0  3.17a  RUE Rest Tremor Amplitude: 0  3.17b LUE Rest Tremor Amplitude: 0  3.17c RLE Rest Tremor Amplitude: 0  3.17d LLE Rest Tremor Amplitude: 0  3.17e Constancy of Rest tremor: 0  DYSKINESIA IMPACT ON PART III RATINGS  Were dyskinesias (chorea or dystonia) present during examination?: No  MDS UPDRS 3 Total: 21          Assessment:     1. Parkinson's disease (CMS Dx)    2. Paresthesia of finger    3. Memory difficulties      #PD complicated by motor fluctuations and dyskinesia, visual hallucinations. Reducing the mg per dose and reducing time interval between doses of Sinemet has improved motor fluctuations. Tongue dyskinesia persists, chewing gum helps. Visual hallucinations improved.      #cognitive decline, visual hallucinations. No longer on Vesicare for urinary urgency,  mirabegron was cost prohibited.  He has an appointment for cystoscopy in March.     #paresthesia Right fingers after an injection in his wrist  for carpel tunnel.       Plan:     1.  Continue Sinemet 3 tablets four times a day. This has helped improved motor fluctuations   2.  Continue rivastigmine 6 mg BID. Will followup with Dr. Rema Jasmine about dosing recommendations   3.  EMG upper extremities to evaluate right finger paresthesia.   4.  Followup with Dr. Rema Jasmine in 3 months.             Portion of this note have been copied and forward but have been reviewed and updated if needed and are accurate to date.    I have spent 45 minutes face to face with this patient with greater than 50% of this time spent in counseling and coordination of care discussing the above issues.  See Assessment and Plan for details.    Lenn Sink, CNP

## 2020-01-16 MED ORDER — rivastigmine tartrate (EXELON) 6 MG capsule
6 | ORAL_CAPSULE | ORAL | 3 refills | Status: AC
Start: 2020-01-16 — End: 2020-04-13

## 2020-01-16 NOTE — Unmapped (Signed)
Patient is requesting a refill on- rivastigmine tartrate (EXELON) 6 MG capsule    Medication Last Filled: Pharm stated that they did not get last script that was sent to them 06/05/19      Last Office Visit: 01/01/2020       Future Office Visit: 04/13/2020      90 day supply: yes        Patients Pharmacy:   [facility] [address], [place] - [address] AT [facility]  [address]  [place] [address]  Phone: [phone] Fax: [phone]

## 2020-02-17 NOTE — Unmapped (Signed)
William Russell requesting referral for PT at [facility] w/ Bruce Donath     States he needs MD to put Bruce Donath on the referral as his therapist , he has worked well with her in the past     [facility] [phone] (ph)    Patient can be reached at [phone] (H)

## 2020-03-17 NOTE — Unmapped (Signed)
Order faxed with successful result. Call complete.

## 2020-03-17 NOTE — Unmapped (Signed)
Pt calling  Pt needs a new PT referral fro Parkinsons sent to   [facility] [place] need to be on PT referral so she can see patient  Fax# [phone]

## 2020-03-26 NOTE — Unmapped (Signed)
Called pt. No answer when called. LMOV informing pt that he is currently taking rivastigmine tartrate (EXELON) 6 MG--TAKE ONE CAPSULE BY MOUTH TWO TIMES A DAY. Call complete.

## 2020-03-26 NOTE — Unmapped (Signed)
Patient has questions regarding his medication rivastigmine 6mg  dose. Needs clarification on the milligram he is supposed to be on.     [phone]

## 2020-03-29 NOTE — Unmapped (Signed)
Spoke with pharmacy and confirmed that pt already has an active script on file for the rivastigmine tartrate (EXELON) 6 MG capsule with 3 refills. Call complete.

## 2020-03-29 NOTE — Unmapped (Signed)
Pt calling being argumentative stating that Dr. Rema Jasmine stated that he is suppose to be taking rivastigmine tartrate (EXELON) 3 MG capsule 2 times a day instead of 6 MG 2 times a day. There is no documentation stating that the rivastigmine tartrate (EXELON) capsules were changed from 6 mg to 3 mg. In the last OV dated 01/01/20 the plan states: 2. Continue rivastigmine 6 mg BID. Will follow up with Dr. Rema Jasmine about dosing recommendations. Please confirm what dosage the pt is suppose to be taking.

## 2020-03-30 NOTE — Unmapped (Signed)
Patient is calling regarding the rivastigmine 6mg  script that  is pending.Patient states he has been on the 3mg . Please advise.         [phone]

## 2020-03-30 NOTE — Unmapped (Signed)
Message from MD:     Virgina Organ, MD  Moody Bruins, MA  Caller: Unspecified Burgess Estelle, ??1:31 PM)    Please let William Russell know that the plan was for him to take 6mg  BID not 3mg  BID. However, if he is concerned for the development of side effects the dose can be reduced. He has been prescribed rivastigmine 6mg  BID since 06/05/2019 and before that was prescribed rivastigmine 4.5mg  BID starting 03/13/2019. Before that he was prescribed rivastigmine 3mg  BID.     Let me know if he wants to reduce his dose to 3mg  BID and I can send a new prescription to his pharmacy.

## 2020-03-30 NOTE — Unmapped (Signed)
Called to speak with pt. No answer when called. Also called pt's spouse [name]. No answer when called. LMOV for them to return call to the office regarding medication clarification.

## 2020-03-30 NOTE — Unmapped (Signed)
Called to speak with pt. No answer when called. Also called pt's spouse [name]. No answer when called. LMOV for them to return call to the office regarding medication clarification.

## 2020-03-30 NOTE — Unmapped (Signed)
Called pt. No answer when called. LMOV for pt to return call to the office.

## 2020-03-30 NOTE — Unmapped (Signed)
Patient is calling to discuss the

## 2020-03-30 NOTE — Unmapped (Signed)
error 

## 2020-03-31 NOTE — Unmapped (Signed)
Call addressed in another telephone encounter. Call complete.

## 2020-03-31 NOTE — Unmapped (Signed)
Pt returned call to the office. Relayed message from MD regarding medication clarification for rivastigmine tartrate (EXELON) 6 MG. Pt stated that when he took the 6mg  before the med made him feel a little loopy but can't exactly remember. Pt stated that he will start taking the 6mg  to see how it makes him feel. Pt will call if he has any issues with the medication. Call complete.

## 2020-03-31 NOTE — Unmapped (Signed)
Pt returned missed call / transferred to the MA.

## 2020-03-31 NOTE — Unmapped (Signed)
Called to speak with pt. No answer when called. Also called pt's spouse [name] and daughter Baxter Hire. No answer when called. LMOV from them to return call to the office regarding med clarification.    The patient has been contacted on more than 3 attempts. All attempts are documented. Patient has not returned any calls that were made to [phone]; [phone]; [phone]. Encounter closed without resolution.

## 2020-04-05 ENCOUNTER — Ambulatory Visit: Admit: 2020-04-05 | Payer: MEDICARE | Attending: Acute Care

## 2020-04-05 DIAGNOSIS — Z01818 Encounter for other preprocedural examination: Secondary | ICD-10-CM

## 2020-04-05 LAB — BASIC METABOLIC PANEL
Anion Gap: 6 mmol/L (ref 3–16)
BUN: 20 mg/dL (ref 7–25)
CO2: 29 mmol/L (ref 21–33)
Calcium: 8.9 mg/dL (ref 8.6–10.3)
Chloride: 103 mmol/L (ref 98–110)
Creatinine: 0.98 mg/dL (ref 0.60–1.30)
Glucose: 96 mg/dL (ref 70–100)
Osmolality, Calculated: 288 mOsm/kg (ref 278–305)
Potassium: 3.9 mmol/L (ref 3.5–5.3)
Sodium: 138 mmol/L (ref 133–146)
eGFR AA CKD-EPI: 83 See note.
eGFR NONAA CKD-EPI: 72 See note.

## 2020-04-05 LAB — CBC
Hematocrit: 36.8 % (ref 38.5–50.0)
Hemoglobin: 12.8 g/dL (ref 13.2–17.1)
MCH: 32.8 pg (ref 27.0–33.0)
MCHC: 34.8 g/dL (ref 32.0–36.0)
MCV: 94 fL (ref 80.0–100.0)
MPV: 7.7 fL (ref 7.5–11.5)
Platelets: 153 10*3/uL (ref 140–400)
RBC: 3.91 10*6/uL (ref 4.20–5.80)
RDW: 13.7 % (ref 11.0–15.0)
WBC: 6.9 10*3/uL (ref 3.8–10.8)

## 2020-04-05 NOTE — Unmapped (Signed)
ANESTHESIOLOGY CONSULTATION AND PRE-OPERATIVE HISTORY AND PHYSICAL      [facility] Attending Physician: Dr. Lewis Shock  [facility] NP / PA: Christ Kick, CNP    Date of Surgery: 04/19/2020  Surgeon:  Dr. Ree Shay      Diagnosis:  Benign prostatic hyperplasia, unspecified whether lower urinary tract symptoms present  Procedure: Cystoscopy, Transurethral resection of the prostate under general anesthesia     Patient ID: William Russell is a 85 y.o. male.    Chief Complaint   Patient presents with   ??? Pre-op Exam     Benign prostatic hyperplasia, unspecified whether lower urinary tract symptoms present     Referral Indication: Risk Stratification    History of Present Illness:      William Russell 85 year old gentleman HTN, Depression, BPH, OSA with BiPap, and Parkinson's Dx.   He has BPH with lower urinary tract symptoms. [facility] follows [facility] - Dr. Ree Shay. Has s NGB d/t Parkison's Disease. 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 He presents today for pre op anesthesia evaluation.     Medical History:     Past Medical History:   Diagnosis Date   ??? Benign prostatic hyperplasia    ??? BPH (benign prostatic hyperplasia)    ??? CPAP (continuous positive airway pressure) dependence     hasn't used for a few months d/t recall   ??? Depression    ??? Hearing aid worn    ??? High cholesterol    ??? HOH (hard of hearing)    ??? Hypertension    ??? OSA (obstructive sleep apnea)     uses CPAP   ??? Parkinson's disease (CMS Dx)    ??? PONV (postoperative nausea and vomiting)    ??? Wears glasses    ??? Wears hearing aid     bilat       Surgical History:     Past Surgical History:   Procedure Laterality Date   ??? CARPAL TUNNEL RELEASE Left    ??? CHOLECYSTECTOMY     ??? CYSTOSCOPY URODYNAMICS N/A 10/15/2019    Procedure: URODYNAMICS;  Surgeon: Marcos Eke, CNP;  Location: [facility] OR [facility];  Service: Urology;  Laterality: N/A;   ??? LUMBAR SPINE SURGERY     ??? OTHER SURGICAL HISTORY Left     left ear  surgery for otosclosis   ??? SHOULDER SURGERY Bilateral     right shoulder x2       Family History:     Family History   Problem Relation Age of Onset   ??? Cancer Mother    ??? Coronary artery disease Mother    ??? Other Father    ??? Parkinsonism Neg Hx        Allergies:     Allergies   Allergen Reactions   ??? Oxybutynin Other (See Comments)     Hallucinations       Medications:     Prior to Admission medications taking for visit date 04/05/20   Medication Sig Taking? Authorizing Provider   allopurinol (ZYLOPRIM) 300 MG tablet Take 300 mg by mouth daily. Yes Historical Provider, MD   aspirin 325 MG tablet Take 325 mg by mouth daily. Yes Historical Provider, MD   atorvastatin (LIPITOR) 20 MG tablet TAKE 1 TABLET BY MOUTH ONE TIME A DAY AT BEDTIME Yes Historical Provider, MD   buPROPion XL (WELLBUTRIN XL) 150 MG 24 hr tablet Take 150 mg by mouth  every morning.        Yes Historical Provider, MD   calcium citrate-vitamin D (CITRACAL+D) 315-200 mg-unit Take 1 tablet by mouth daily. Yes Historical Provider, MD   carbidopa-levodopa (SINEMET) 25-100 mg per tablet Take 3.5 tablets by mouth 4 times a day.  Patient taking differently: Take 3 tablets by mouth 4 times a day.        Yes Lenn Sink, CNP   finasteride (PROSCAR) 5 mg tablet TAKE ONE TABLET BY MOUTH DAILY Yes [name] P Hunter, CNP   fluticasone propionate (FLONASE) 50 mcg/actuation nasal spray Use 1 spray into each nostril at bedtime. Yes Historical Provider, MD   folic acid (FOLVITE) 400 MCG tablet Take 400 mcg by mouth daily. Yes Historical Provider, MD   glucosamine-chondroitin 500-400 mg tablet Take 1 tablet by mouth daily. Yes Historical Provider, MD   hyalur ac/chond sul/colg II/AA (HYALURONIC ACID, CHOND-COLLGN, ORAL) Take 1 tablet by mouth every morning. Yes Historical Provider, MD   loratadine (CLARITIN) 10 mg tablet Take 10 mg by mouth daily as needed for Allergies. Yes Historical Provider, MD   metoprolol succinate (TOPROL-XL) 25 MG 24 hr tablet Take 25 mg by  mouth at bedtime.        Yes Historical Provider, MD   montelukast (SINGULAIR) 10 mg tablet Take 10 mg by mouth every morning.        Yes Historical Provider, MD   multivit-min/FA/lycopen/lutein (CENTRUM SILVER MEN ORAL) Take by mouth. Yes Historical Provider, MD   niacin 100 MG tablet Take 500 mg by mouth daily with breakfast.        Yes Historical Provider, MD   omega-3 fatty acids-fish oil 300-1,000 mg capsule Take 1 capsule by mouth daily. Yes Historical Provider, MD   omeprazole (PRILOSEC) 20 MG capsule Take 20 mg by mouth daily.        Yes Historical Provider, MD   paroxetine (PAXIL) 30 MG tablet Take 30 mg by mouth every morning.        Yes Historical Provider, MD   rivastigmine tartrate (EXELON) 6 MG capsule TAKE ONE CAPSULE BY MOUTH TWICE A DAY Yes Virgina Organ, MD   tamsulosin (FLOMAX) 0.4 mg Cap TAKE ONE CAPSULE BY MOUTH EVERY NIGHT AT BEDTIME Yes [name] Bradly Bienenstock, CNP   UNABLE TO FIND Med Name: CBD Yes Historical Provider, MD   saw palmetto 500 MG capsule Take 500 mg by mouth daily. Yes Historical Provider, MD   ibuprofen (MOTRIN) 200 MG tablet Take 200 mg by mouth every 6 hours as needed for Pain.  Historical Provider, MD   mupirocin (BACTROBAN) 2 % ointment   Historical Provider, MD   naproxen sodium (ANAPROX) 220 MG tablet Take 220 mg by mouth 2 times a day with meals.  Historical Provider, MD   polyethylene glycol (GLYCOLAX) 17 gram/dose powder Take 17 g by mouth daily.  Patient taking differently: Take 17 g by mouth if needed.         Patrecia Pour, MD   solifenacin (VESICARE) 5 MG tablet TAKE ONE TABLET BY MOUTH DAILY  Marcos Eke, CNP        Review of Systems   Constitutional: Negative for activity change, appetite change, chills, diaphoresis, fatigue, fever, weight gain and weight loss.   HENT: Positive for dental problem, hearing loss, postnasal drip and rhinorrhea. Negative for congestion, ear pain, nosebleeds, sinus pressure, sore throat and tinnitus.         Hearing aids    Needing a few  crowns    Eyes: Positive for visual disturbance. Negative for photophobia, pain, discharge, redness and itching.        Glasses   Respiratory: Positive for apnea. Negative for cough, choking, chest tightness, shortness of breath, wheezing and stridor.         CPAP- recent recall- hasn't received a new one    Cardiovascular: Negative for chest pain, palpitations and leg swelling.   Gastrointestinal: Negative for abdominal pain, blood in stool, constipation, diarrhea, heartburn, nausea and vomiting.   Genitourinary: Positive for difficulty urinating and nocturia. Negative for decreased urine volume, dysuria, hematuria and urgency.   Musculoskeletal: Positive for arthralgias, back pain and neck stiffness. Negative for gait problem, joint swelling, myalgias and neck pain.   Skin: Negative for color change, pallor, rash and wound.   Neurological: Positive for dizziness, tremors and weakness. Negative for seizures, facial asymmetry, light-headedness and headaches.        Denies hx of seziures and stroke    Follows Neurology for Parkinsons and memory problems     Dizziness at times with position change- for years.    Hematological: Does not bruise/bleed easily.        Denies hx DVT and PE    Psychiatric/Behavioral: Negative for behavioral problems, depression, sleep disturbance and suicidal ideas. The patient is nervous/anxious.        Objective:   Blood pressure 105/56, pulse 67, temperature 98.2 ??F (36.8 ??C), temperature source Oral, resp. rate 18, height 5' 8 (1.727 m), weight 195 lb 4.8 oz (88.6 kg), SpO2 99 %.    Physical Exam  Constitutional:       General: He is not in acute distress.     Appearance: He is well-developed. He is not diaphoretic.      Comments: Body mass index is 29.7 kg/m??.     HENT:      Head: Normocephalic and atraumatic.      Right Ear: Hearing and external ear normal.      Left Ear: Hearing and external ear normal.      Ears:      Comments: Hearing aids     Nose: Nose normal.      Mouth/Throat:       Pharynx: Uvula midline.   Eyes:      General: Lids are normal. No scleral icterus.     Conjunctiva/sclera: Conjunctivae normal.      Pupils: Pupils are equal, round, and reactive to light.   Neck:      Thyroid: No thyroid mass or thyromegaly.      Vascular: No carotid bruit or JVD.      Trachea: No tracheal deviation.   Cardiovascular:      Rate and Rhythm: Normal rate and regular rhythm.      Pulses:           Radial pulses are 2+ on the right side and 2+ on the left side.      Heart sounds: Normal heart sounds, S1 normal and S2 normal. No murmur heard.  No friction rub. No gallop.       Comments: RRR    No lower extremity edema   Pulmonary:      Effort: Pulmonary effort is normal. No respiratory distress.      Breath sounds: Normal breath sounds. No stridor. No wheezing, rhonchi or rales.      Comments: clear  Chest:      Chest wall: No tenderness.   Abdominal:  General: Bowel sounds are normal. There is no distension.      Palpations: Abdomen is soft. There is no mass.      Tenderness: There is no abdominal tenderness. There is no guarding or rebound.   Musculoskeletal:         General: No tenderness. Normal range of motion.      Cervical back: Normal range of motion and neck supple.   Lymphadenopathy:      Cervical: No cervical adenopathy.   Skin:     General: Skin is warm and dry.      Coloration: Skin is not pale.      Findings: No erythema, petechiae or rash. Rash is not purpuric.      Nails: There is no clubbing.   Neurological:      Mental Status: He is alert and oriented to person, place, and time. Mental status is at baseline.      Cranial Nerves: No cranial nerve deficit.      Sensory: No sensory deficit.      Motor: No tremor or abnormal muscle tone.      Coordination: Coordination normal.      Gait: Gait normal.      Comments: Subtle tremors at times    Psychiatric:         Speech: Speech normal.         Behavior: Behavior normal.         Thought Content: Thought content normal.          Judgment: Judgment normal.         Lab Review:   BMP:       Lab name 04/05/20  1026   SODIUM 138   POTASSIUM 3.9   CHLORIDE 103   CO2 29   BUN 20     CBC:       Lab name 04/05/20  1026   WBC 6.9   HEMOGLOBIN 12.8*   HEMATOCRIT 36.8*   MCH 32.8   PLATELETS 153     COAGS:     RENAL:      Lab name 04/05/20  1026   GLUCOSE 96   BUN 20   CREATININE 0.98   SODIUM 138   POTASSIUM 3.9   CHLORIDE 103   CO2 29   CALCIUM 8.9     HEP:     HGBA1C:         Study Results:   Study Results:      LHC: 11/06/2014:  HISTORY   The patient is a 85 year old who presented with chest discomfort, pressure.     Abnormal nuclear stress test.     Diagnostic study performed to assess coronary artery disease.     Left heart catheterization, left ventriculography, coronary arteriography.     Initially, radial procedure was to be performed. However, Freida Busman test was not normal. Therefore, right common femoral arterial access was obtained after usual subcutaneous lidocaine.     Single puncture. A 6-French short sheath.     Left heart catheterization, left ventriculography, coronary arteriography.     Left ventriculography showed a normal size left ventricle minimal inferoapical hypokinesia. 60% ejection fraction, 2 inferior wall diverticuli actually due to extensive trabeculation of the inferior wall of the left ventricle.     Prominent mitral leaflets.     Prominent coronary sinuses of Valsalva.     Right dominant system, but the left is the larger of the coronaries.     Right heart with minimal plaque  disease. 30% mid.     Left main patent. LAD and circumflex with minimal plaque disease, no more than 30%.     There is adequate coronary blood supply.     Risk factor modification remains key in his ongoing cardiovascular health.     Atorvastatin 20 mg daily will be initiated.     Tolerated well.     Mynx closure.     The signature of the attending physician serves as a countersignature for the Thereasa Parkin of the document unless the attending is the Chartered loss adjuster.      Stress Test: 10/16/2014:  Impression: ??   1. Nonischemic hypertensive clinical response to exercise stress with fair exercise tolerance.   2 ??Nonischemic electrocardiographic response to exercise stress.   3. Abnormal myocardial perfusion imaging study.   ???? ??- Medium-sized inferior and basal inferolateral moderate ischemia.   ???? ??- Normal LV cavity size with LVEDV of 79 ml post stress.   ???? ??- Normal wall motion.   ???? ??- LVEF 55 % post stress.         Anesthesia Considerations:   ASA Physical Status:  3    Duke Activity Scale:  3 - Walking on a flat surface for one or two blocks.    Airway:  Mallampati II (hard and soft palate, upper portion of tonsils anduvula visible), Thyromental distance 3 finger breadths, opening 3 finger breadths    IV Access: Appears acceptable    Anesthesia Complications: some PONV in the past. Denies any issues or complications with anesthesia.     Assessment and Recommendations:   85 year old gentleman with Benign prostatic hyperplasia, unspecified whether lower urinary tract symptoms present  To undergo Cystoscopy, Transurethral resection of the prostate under general anesthesia.     Current Medical Conditions Include:    1. Cardiac risk/functional status: denies CAD/CHF. Follows with Cards for HTN and primary prevention.   He had a stress test in 2016 which was abnormal Medium-sized inferior and basal inferolateral moderate ischemia. Normal LV cavity size with LVEDV of 79 ml post stress. Normal wall motion. LVEF 55 % post stress. This was followed by a LHC: no obstructive disease.   Medically managed with ASA 325mg , statin and BB- will hold ASA for surgery.     Cards visit 11/18/2019:   Plan   I saw Mr. AMBROSIO REUTER. He does not offer any particular cardiac complaints. Blood pressures under good control. Activity tolerance is good. No angina or anginal equivalent symptoms. I have requested for blood work to check his CBC, CMP, and lipid profile. If blood work is unrevealing, he will  continue with current medications and risk factor modification for CAD, abnormal stress test, and obstructive sleep apnea. He will follow up with Dr. Beckey Downing in 1 year. Patient was encouraged to call our office for questions or concerns in the interim.    Denies CP, SOB, PND, leg swelling, and orthopnea. Duke activity score 3. He lives in a house with his wife and they are independent. 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 He can perform ADLs and simple tasks without chest pain or sob. He has a 15 step staircase down to his basement that he can go up and down without chest pain or sob. Cardiac exam benign. No further work up indicated prior to OR.     2. HTN: BP today 105/56 currently taking metoprolol which he can continue  perioperatively and take am of OR.     3. HLD: currently taking atorvastatin. Instructed to continue perioperatively.      4. Parkinson's Dx: follows with [facility] - [facility] 01/01/20. Currently taking carbidopa-levodopa and rivastigmine - both which he should continue perioperatively.     5. Mood Disorder: currently taking paxil & wellbutrin- both which he can continue perioperatively.     6. OSA: CPAP nightly which he is waiting for a new machine as last one was recalled.     7. Gout: currently taking allopurinol. Instructed to continue perioperatively. No recent flare up.    8. GERD: currently taking omeprazole. Instructed to continue perioperatively    9. BPH: currently taking tamsulosin which he can continue perioperatively.     Today, we obtained: CBC & BMP    Pre procedural instructions discussed with patient, they verbalized understanding.   I have conveyed my assessment and recommendations to the referring provider via shared EMR.     Christ Kick, CNP    Number and Complexity of Problems Addressed  2 or more stable chronic illnesses  1 acute, uncomplicated illness or injury    Amount and/or Complexity of Data to be  Reviewed and Analyzed  3+ unique tests ordered    Risk of Complications and/or Morbidity or Mortality of Patient Management  High    Time  I spent a total of 97 minutes on the day of the visit.

## 2020-04-05 NOTE — Unmapped (Addendum)
Pre-Procedure Instructions    We're pleased that you have chosen [facility] for your upcoming procedure.  The staff serving you is professionally trained to provide the highest quality care.  We encourage you to ask questions and to let the staff know your special needs.  We want your visit to be as comfortable as possible. Below you will find instructions for your upcoming procedure. Please read the instructions and follow closely, failure to follow instructions may lead to delay or cancellation of your surgery.         Your surgery is scheduled on 3/7   Please arrive at 830 AM and check in at  [facility].  The registration desk will be on your right as soon as you enter the lobby.    The following instructions can be found in your [facility] account under your  After Visit Summary    1 WEEK BEFORE PROCEDURE     ? STOP/ DO NOT TAKE   ??? ASPIRIN  ??? NSAIDS (non-steroidal anti-inflammatories such as Ibuprofen, Advil, Aleve, Naproxen, Excedrin, Meloxicam, Mobic, Celebrex, Diclofenac, and Voltaren)  ??? SUPPLEMENTS  ??? FISH OIL  ??? VITAMINS  ??? HERBAL SUPPLEMENTS    ??? CBD OIL    ? OK TO TAKE   ??? ACETAMINOPHEN (TYLENOL)    If you have a cold or are sick ANYTIME prior to surgery, contact your surgeon.                           MORNING OF SURGERY     You may brush your teeth on the morning of surgery.    DO NOT EAT OR DRINK ANYTHING (including gum, mints, water, etc.) after midnight    ?TAKE ONLY these medications the morning of surgery with a small sip of water   ALLOPURINOL  BUPROPION  CARBIDOPA-LEVIDOPA  FINASTERIDE  MONTELUKAST  OMEPRAZOLE  PAROXETINE  RIVASTIGMINE     Please shower BOTH the night before and the morning of surgery. Use an antibacterial soap, such as Dial or Safeguard.    ?Remove all jewelry, wristwatch, body piercings  ?Do not use powder, lotions, deodorant, and perfume/cologne     WHAT TO WEAR:  Wear casual, loose fitting, and comfortable clothing           BRING WITH YOU     ?List of  your medications and dose (ex: Metoprolol 50 mg, twice daily)  ?List of over-the-counter medications (vitamins, supplements??? ex: COQ10 300 mg, once daily).    ?Photo ID  ?Insurance card   ?Glasses and Case  ?Hearing aids and Case  ?Rescue Inhaler (albuterol)    ?For OVERNIGHT stay - pack a small bag of items that you may need               (change of clothing, toiletries, phone charger???)   - locker space is limited in the surgery area     LEAVE AT HOME     We recommend that you leave valuables (ex. money, jewelry, credit cards) at home or with your family.   ?Do not bring any medications to the hospital.     TRANSPORTATION and VISITORS     Please make transportation arrangements and bring, at least, ONE responsible adult to accompany you home and remain with you for 24 hours after having received anesthesia.              You will not be permitted to drive, take a  taxi, uber, Catering manager., by yourself    At this time, we are allowing ONLY TWO ADULT VISITORS per patient in our facilities.      ALL visitors must wear a mask and will be screened at check in    VISITING HOURS: 9 am-7 pm, visitors will not be able to spend the night   ICU visiting hours 2 pm-4 pm  Visitors must be at least 83 years of age         OTHER USEFUL INFORMATION (FYI)     [facility]: Check in at the Registration Desk on your right as you enter the lobby.  [facility]: Check in at the desk when you enter the building.  [facility]: Check in on 2nd floor of the Diagnostic Center     If you have any questions regarding these instructions please contact at:                           [facility]  Monday - Friday 8:00 am - 4:30 pm  [phone].                                                                              ADDRESS    [facility]  [facility]   [address]  [place], [place] [address]  Surgery area phone number:  [phone] [address]  [place], [place] [address]  Surgery Center phone number: [phone] [address]  [place], [place] [address]  Surgery area phone number:  [phone]     For questions regarding surgery, please call your surgeon        WHAT TO EXPECT     ??? Make sure all of your health care givers are checking your ID bracelet and verifying your name and date of birth.  You will actively be involved in verifying the type of surgery you are having and the correct site.      ??? Your health care givers should be cleaning their hands with soap and water or antibacterial foam before taking care of you and if they do not, it is ok to remind them to do so.  Antibacterial showering and good hand hygiene are essential to prevent surgical site infections and reduce the spread of MRSA.     ??? In an effort to reduce the risks of blood clots after your surgery you may have compression sleeves on your lower legs.  These sleeves help facilitate circulation and decrease the chances of developing any blood clots.     ??? You may be given an incentive spirometer after surgery to use every hour to help prevent pneumonia by having you take deep breaths in and out.  You will be given instructions about proper use after surgery.

## 2020-04-06 NOTE — Unmapped (Signed)
Tried to call patient but voice mail is full.  Will try again later.

## 2020-04-06 NOTE — Unmapped (Signed)
Pt calling to req a call from MD or CNP. Pt has an urgent req for a call from ofc re:a form he must complete for Charlynn Grimes, pt's attorney. The form needs to be completed today, hence the urgency.

## 2020-04-07 NOTE — Unmapped (Signed)
Spoke with patient and wife.  They had a lot of questions re: surgery.  Patient is scheduled for TURP 04/19/20.  Reviewed post op restrictions and healing with Dr. Sherlyn Lick.  Wife is having surgery 2 weeks later and wants to know if he will be able to take care of her.  Patient should have a back up plan just in case patient is still healing.  Informed he can't drive while still taking pain medication.  Patient will call if any other questions.

## 2020-04-13 ENCOUNTER — Institutional Professional Consult (permissible substitution): Admit: 2020-04-13 | Discharge: 2020-04-21 | Payer: Medicare (Managed Care)

## 2020-04-13 ENCOUNTER — Ambulatory Visit: Admit: 2020-04-13 | Payer: Medicare (Managed Care)

## 2020-04-13 ENCOUNTER — Ambulatory Visit: Admit: 2020-04-13 | Discharge: 2020-04-23 | Payer: Medicare (Managed Care) | Attending: Neurology

## 2020-04-13 DIAGNOSIS — N400 Enlarged prostate without lower urinary tract symptoms: Secondary | ICD-10-CM

## 2020-04-13 DIAGNOSIS — G20 Parkinson's disease: Secondary | ICD-10-CM

## 2020-04-13 DIAGNOSIS — Z01818 Encounter for other preprocedural examination: Secondary | ICD-10-CM

## 2020-04-13 LAB — 2019 NOVEL CORONAVIRUS (COVID-19), NAA-B: SARS-CoV-2: NOT DETECTED

## 2020-04-13 MED ORDER — carbidopa-levodopa (SINEMET) 25-100 mg per tablet
25-100 | ORAL_TABLET | Freq: Four times a day (QID) | ORAL | 3 refills | Status: AC
Start: 2020-04-13 — End: 2021-02-21

## 2020-04-13 MED ORDER — gabapentin (NEURONTIN) 100 MG capsule
100 | ORAL_CAPSULE | ORAL | 11 refills | Status: AC
Start: 2020-04-13 — End: 2020-09-26

## 2020-04-13 MED ORDER — rivastigmine tartrate (EXELON) 6 MG capsule
6 | ORAL_CAPSULE | Freq: Two times a day (BID) | ORAL | 3 refills | Status: AC
Start: 2020-04-13 — End: 2021-02-21

## 2020-04-13 NOTE — Unmapped (Signed)
Covid-19 nasopharyngeal specimen collected.

## 2020-04-13 NOTE — Unmapped (Signed)
Subjective:      Patient ID: William Russell is a 85 y.o. male.    Movement Disorders HPI       Handedness: Right  Chief Complaint: PD  Patient accompanied by and additional history obtained from: wife  Age at onset of symptoms: 30  Duration of symptoms: 5 years    Follow up for PD. Plan at last visit 01/01/20:    1.  Continue Sinemet 3 tablets four times a day. This has helped improved motor fluctuations   2.  Continue rivastigmine 6 mg BID. Will followup with Dr. Rema Jasmine about dosing recommendations   3.  EMG upper extremities to evaluate right finger paresthesia.     Interview:   Accompanied by his wife. She thinks he has declined since 02/2020 associated with temporarily stopping PT. 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 Memory and walking has gotten worse. He reports that his chronic back pain has flared up. He has fallen a few times since last visit. He has a warning prior to falls - gets staggery prior to fall. Staggery feeling is provoked sometimes by standing up from a chair or turning. Never passed out before. His tremor returns 4 hours after each dose. He takes Sinemet IR 25/100 x 3 tablets every 4-5 hours hours while awake (4 times per day). The tremor is mildly bothersome when it occurs. Denies bothersome dyskinesia. Wife has never observed this either. Hallucinations and dream enactment behaviors improved with reduced Sinemet dose.     PD Quality Metrics  Falls (Review Each Visit): present     Motor Complications (Review Each Visit): present     Depression (Review Annually): denies     Apathy (Review Annually): denies     Anxiety (Review Annually): denies     Cognitive Impairment (Review Annually): present      Autonomic Dysfunction (Review Annually): denies     Insomnia/Sleep Disturbance (Review Annually): denies     PD Dx Review (Review Annually): yes     PD Med and Surg Option (Review Annually): yes     PD Rehab Options (Review  Annually): yes     PD Safety Counseling (Review Annually): yes       Other Symptoms  Gait Freezing: denies     Excessive Sweating: denies     Urinary Incontinence: present  urgency        Dysphagia: denies     Dream Enactment Behavior: present  previously has fallen out of bed  Anosmia: present     Constipation: denies  previously this was a problem  Hypophonia: present     Hypomimia: present     Micrographia: present        Med Side Effects  Nausea: denies     Vomiting: denies     Lightheadedness: present  rarely  Fainting: denies     Daytime Sedation: denies     Dyskinesia: present  tongue  Hallucinations: present  rarely at night  Delusions: denies     Paranoia: denies     Impulse Control Disorder: denies       Motor Fluctuations  Onset of Benefit: 20-30 minutes  Duration of Benefit: 4hours  Wearing Off Prior to Next Dose: if late taking a dose    Imaging       Etiol Risk  Histories:     Past Medical History:   Diagnosis Date   ??? Benign prostatic hyperplasia    ??? BPH (benign prostatic hyperplasia)    ??? CPAP (continuous positive airway pressure) dependence     hasn't used for a few months d/t recall   ??? Depression    ??? Hearing aid worn    ??? High cholesterol    ??? HOH (hard of hearing)    ??? Hypertension    ??? OSA (obstructive sleep apnea)     uses CPAP   ??? Parkinson's disease (CMS Dx)    ??? PONV (postoperative nausea and vomiting)    ??? Wears glasses    ??? Wears hearing aid     bilat       Past Surgical History:   Procedure Laterality Date   ??? CARPAL TUNNEL RELEASE Left    ??? CHOLECYSTECTOMY     ??? CYSTOSCOPY URODYNAMICS N/A 10/15/2019    Procedure: URODYNAMICS;  Surgeon: Marcos Eke, CNP;  Location: [facility] OR [facility];  Service: Urology;  Laterality: N/A;   ??? LUMBAR SPINE SURGERY     ??? OTHER SURGICAL HISTORY Left     left ear surgery for otosclosis   ??? SHOULDER SURGERY Bilateral     right shoulder x2       Family History   Problem Relation Age of Onset   ??? Cancer Mother    ??? Coronary artery disease  Mother    ??? Other Father    ??? Parkinsonism Neg Hx        Social History     Socioeconomic History   ??? Marital status: Married     Spouse name: Not on file   ??? Number of children: Not on file   ??? Years of education: Not on file   ??? Highest education level: Not on file   Occupational History   ??? Not on file   Tobacco Use   ??? Smoking status: Never Smoker   ??? Smokeless tobacco: Never Used   Substance and Sexual Activity   ??? Alcohol use: No   ??? Drug use: No     Comment: CBD oil   ??? Sexual activity: Not on file   Other Topics Concern   ??? Caffeine Use Yes   ??? Occupational Exposure No   ??? Exercise Yes   ??? Seat Belt Yes   Social History Narrative   ??? Not on file     Social Determinants of Health     Financial Resource Strain: Not on file   Physical Activity: Not on file   Stress: Not on file   Social Connections: Not on file   Housing Stability: Not on file       Review of Systems  Refer to HPI for review of systems documentation. All other systems reviewed and negative.     Allergies:   Oxybutynin    Medications:     Current Outpatient Medications:   ???  allopurinol (ZYLOPRIM) 300 MG tablet, Take 300 mg by mouth daily., Disp: , Rfl:   ???  aspirin 325 MG tablet, Take 325 mg by mouth daily., Disp: , Rfl:   ???  atorvastatin (LIPITOR) 20 MG tablet, TAKE 1 TABLET BY MOUTH ONE TIME A DAY AT BEDTIME, Disp: , Rfl: 3  ???  buPROPion XL (WELLBUTRIN XL) 150 MG 24 hr tablet, Take 150 mg by mouth every morning.    , Disp: , Rfl:   ???  calcium citrate-vitamin D (CITRACAL+D)  315-200 mg-unit, Take 1 tablet by mouth daily., Disp: , Rfl:   ???  carbidopa-levodopa (SINEMET) 25-100 mg per tablet, Take 3.5 tablets by mouth 4 times a day. (Patient taking differently: Take 3 tablets by mouth. 3 tabs every 4 hours   ), Disp: 1260 tablet, Rfl: 3  ???  finasteride (PROSCAR) 5 mg tablet, TAKE ONE TABLET BY MOUTH DAILY, Disp: 30 tablet, Rfl: 8  ???  folic acid (FOLVITE) 400 MCG tablet, Take 400 mcg by mouth daily., Disp: , Rfl:   ???  glucosamine-chondroitin  500-400 mg tablet, Take 1 tablet by mouth daily., Disp: , Rfl:   ???  hyalur ac/chond sul/colg II/AA (HYALURONIC ACID, CHOND-COLLGN, ORAL), Take 1 tablet by mouth every morning., Disp: , Rfl:   ???  ibuprofen (MOTRIN) 200 MG tablet, Take 200 mg by mouth if needed for Pain.    , Disp: , Rfl:   ???  loratadine (CLARITIN) 10 mg tablet, Take 10 mg by mouth daily as needed for Allergies., Disp: , Rfl:   ???  metoprolol succinate (TOPROL-XL) 25 MG 24 hr tablet, Take 25 mg by mouth at bedtime.    , Disp: , Rfl:   ???  montelukast (SINGULAIR) 10 mg tablet, Take 10 mg by mouth every morning.    , Disp: , Rfl:   ???  multivit-min/FA/lycopen/lutein (CENTRUM SILVER MEN ORAL), Take by mouth., Disp: , Rfl:   ???  mupirocin (BACTROBAN) 2 % ointment, if needed.    , Disp: , Rfl:   ???  niacin 100 MG tablet, Take 500 mg by mouth daily with breakfast.    , Disp: , Rfl:   ???  omega-3 fatty acids-fish oil 300-1,000 mg capsule, Take 1 capsule by mouth daily., Disp: , Rfl:   ???  omeprazole (PRILOSEC) 20 MG capsule, Take 20 mg by mouth daily.    , Disp: , Rfl:   ???  oxymetazoline (AFRIN) 0.05 % nasal spray, Use 2 sprays into each nostril 2 times a day., Disp: , Rfl:   ???  paroxetine (PAXIL) 30 MG tablet, Take 30 mg by mouth every morning.    , Disp: , Rfl:   ???  polyethylene glycol (GLYCOLAX) 17 gram/dose powder, Take 17 g by mouth daily. (Patient taking differently: Take 17 g by mouth if needed.    ), Disp: 510 g, Rfl: 11  ???  rivastigmine tartrate (EXELON) 6 MG capsule, TAKE ONE CAPSULE BY MOUTH TWICE A DAY (Patient taking differently: 6 mg 2 times a day.    ), Disp: 180 capsule, Rfl: 3  ???  tamsulosin (FLOMAX) 0.4 mg Cap, TAKE ONE CAPSULE BY MOUTH EVERY NIGHT AT BEDTIME, Disp: 60 capsule, Rfl: 5  ???  UNABLE TO FIND, Med Name: CBD, Disp: , Rfl:     Objective:         Vitals:    04/13/20 1555   BP: 143/71   BP Location: Left arm   Patient Position: Sitting   BP Cuff Size: Large   Pulse: 75   Weight: 184 lb (83.5 kg)   Height: 5' 8 (1.727 m)     Orthostatic  Vitals     04/13/20 1555 04/13/20 1706 04/13/20 1708 04/13/20 1709   Orthostatic BP:  136/77 133/79 131/75   Orthostatic Pulse:  70 78 75   Patient Position: Sitting Lying Standing Standing    04/13/20 1710   Orthostatic BP: 143/82   Orthostatic Pulse: 76   Patient Position: Standing     MDS UPDRS part  3 - Motor Examination  MDS UPDRS III Motor Examination  Date medication last taken: 04/13/20  Time medication last taken : 1330  Medication taken : Sinemet 300mg   Medication : ON  General  3.1 Speech: 1  3.2 Facial Expression: 0  Rigidity  3.3a Rigidity- Neck: 0  3.3b Rigidity right upper: 0  3.3c Rigidity left upper: 0  3.3d Rigidity right lower: 0  3.3e Rigidity left lower: 0  Coordination   3.4a Finger tapping, right : 1  3.4b Finger tapping, left: 1  3.5a Hand movement, right: 2  3.5b Hand movement, left: 1  3.6a Pronation-supination of right hand: 3  3.6b Pronation-supination of left hand: 3  3.7a Toe tapping, right : 3  3.7b Toe Tapping, left: 2  3.8a Leg agility, right: 0  3.8b Leg agility, left: 0  Posture and Gait  3.9 Arising from chair : 1  3.10 Gait: 1  3.11 Freezing of gait: 0  3.12 Postural Stability: 1  3.13 Posture: 2  3.14 Body bradykinesia: 0  Tremor  3.15a Postural tremor, right hand: 1  3.15b Postural Tremor, left hand: 1  3.16a Kinetic Tremor of right hand: 1  3.16b Kinetic tremor of left hand: 1  3.17e Lip/jaw rest tremor: 0  3.17a RUE Rest Tremor Amplitude: 0  3.17b LUE Rest Tremor Amplitude: 0  3.17c RLE Rest Tremor Amplitude: 0  3.17d LLE Rest Tremor Amplitude: 2  3.17e Constancy of Rest tremor: 1  DYSKINESIA IMPACT ON PART III RATINGS  Were dyskinesias (chorea or dystonia) present during examination?: Yes  MDS UPDRS 3 Total: 29  Comments: perioral dyskiensia    Slight weakness right APB. No atrophy. Otherwise full strength BUE distal and proximal. DTR 2/4 BUE.        Assessment:     1. Parkinson's disease (CMS Dx)    2. Dyskinesia    3. Cognitive decline    4. Motor fluctuations related to  medication use in Parkinson's disease (CMS Dx)    5. Falls infrequently      #PD. Parkinsonism is relatively well controlled. Higher dose of levodopa risks outweigh benefits (hallucinations, dyskinesia, lightheadedness). Notably no orthostatic hypotension on exam today to explain lightheadedness. No change to Sinemet today. He developed decline in mobility when he stopped physical therapy so recommended continuing physical therapy. Mobility and range of motion limited by arthritis and spine disease.     #cognitive decline. Rivastigmine appears to be helpful. No evidence of Bergman on exam. Occasional hallucinations. Advised him to stop using CBD.     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 Developed painful tingling and numbness in the median nerve distribution and subtle right APB weakness immediately after an injection of some kind directly over the median nerve at the wrist. This is most likely nerve injury from the injection rather than true carpal tunnel syndrome. No need for EMG. Starting trial of gabapentin for neuropathic pain. Clinical symptoms have been stable since onset.     Plan:     1. Continue Sinemet IR 25/100 x 3 tab QID   2. Continue rivastigmine 6 mg BID  3. Start gabapentin 100mg  QHS x1 week then 200mg  QHS x 1 week then 300mg  QHS (goal dose)  4. No need for EMG  5. PT referral  6. Follow up with Shela Nevin in 58mo and me in 81mo         I have spent 40 minutes of total time on the date of encounter including preparing  to see the patient, obtaining clinical history, performing a medically appropriate examination, reviewing imaging reports and personally reviewing MRI imaging available, documenting clinical information in the electronic health record.

## 2020-04-13 NOTE — Unmapped (Signed)
About Your Test    ?? [facility] administers PCR tests, which look for genetic material of the speci?c coronavirus that causes COVID-19. The PCR test is the gold standard for COVID-19 testing, as it is extremely accurate.    Staying Safe After Your Test    ?? Avoid exposure by staying home, except in an emergency.  ?? If you are scheduled for a procedure or surgery, please quarantine yourself until then -- even after you receive your result.  ?? Wear a mask or cloth face covering when around others if you are experiencing symptoms of COVID-19, such as a new cough, shortness of breath or fever.  ?? Wash your hands often.    Results    ?? Results will be available within 24-36 hours. The quickest way to get your result is to access your personal [facility] (MyChart) account.  ?? If you test positive, you will receive a call if you have not logged into your MyChart account or have not viewed the result in your MyChart.  ?? Per CDC guidelines, a second COVID-19 test should occur at least seven (7) days after testing with a positive result. A test result within this time frame would likely remain positive. Seven days after your positive result test, you can schedule another COVID-19 test.  ?? If you test negative, you will see the result in your MyChart.  ?? If you have any questions about your test or results, please call [phone].  ?? To access [facility], click here or download the MyChart app from the Apple App Store or Google Play.    Billing    ?? Your test will be provided at no cost to you, but if you have insurance, your test will be billed to your individual insurance company. Please contact your insurance carrier if you have questions about coverage.  ?? Please call [phone] for questions about billing.

## 2020-04-14 ENCOUNTER — Ambulatory Visit: Payer: MEDICARE

## 2020-04-16 NOTE — Unmapped (Signed)
Spoke with daughter.  Patient needs to take something for his arthritis.  Discussed with Sander Radon, NP.  Patient can take Gabapentin or Tylenol.  He may not take Ibuprofen, Aleve or Advil.  Daughter informed.

## 2020-04-16 NOTE — Unmapped (Signed)
Daughter asking if pt can take Gabapentin for pain. Daughter states pt was advised to hold all of his pain medications for upcoming on Monday, 04/19/20.

## 2020-04-19 ENCOUNTER — Observation Stay
Admit: 2020-04-19 | Discharge: 2020-04-20 | Disposition: A | Discharge status: Home / Self Care | Payer: Medicare (Managed Care) | Source: Ambulatory Visit

## 2020-04-19 LAB — CBC
Hematocrit: 34.2 % (ref 38.5–50.0)
Hemoglobin: 11.9 g/dL (ref 13.2–17.1)
MCH: 32.4 pg (ref 27.0–33.0)
MCHC: 34.9 g/dL (ref 32.0–36.0)
MCV: 92.8 fL (ref 80.0–100.0)
MPV: 7.9 fL (ref 7.5–11.5)
Platelets: 152 10*3/uL (ref 140–400)
RBC: 3.68 10*6/uL (ref 4.20–5.80)
RDW: 13.3 % (ref 11.0–15.0)
WBC: 5.5 10*3/uL (ref 3.8–10.8)

## 2020-04-19 LAB — BASIC METABOLIC PANEL
Anion Gap: 6 mmol/L (ref 3–16)
BUN: 15 mg/dL (ref 7–25)
CO2: 29 mmol/L (ref 21–33)
Calcium: 8.5 mg/dL (ref 8.6–10.3)
Chloride: 104 mmol/L (ref 98–110)
Creatinine: 0.83 mg/dL (ref 0.60–1.30)
EGFR: 87
Glucose: 117 mg/dL (ref 70–100)
Osmolality, Calculated: 290 mOsm/kg (ref 278–305)
Potassium: 3.9 mmol/L (ref 3.5–5.3)
Sodium: 139 mmol/L (ref 133–146)

## 2020-04-19 MED ORDER — atorvastatin (LIPITOR) tablet 20 mg
20 | Freq: Every evening | ORAL | Status: AC
Start: 2020-04-19 — End: 2020-04-20
  Administered 2020-04-20: 02:00:00 20 mg via ORAL

## 2020-04-19 MED ORDER — oxymetazoline (AFRIN) 0.05 % nasal spray 2 spray
0.05 | Freq: Two times a day (BID) | NASAL | Status: AC
Start: 2020-04-19 — End: 2020-04-20
  Administered 2020-04-19 – 2020-04-20 (×3): 2 via NASAL

## 2020-04-19 MED ORDER — methocarbamoL (ROBAXIN) injection
100 | INTRAMUSCULAR | Status: AC | PRN
Start: 2020-04-19 — End: 2020-04-19
  Administered 2020-04-19: 16:00:00 1000 via INTRAVENOUS

## 2020-04-19 MED ORDER — sugammadex (BRIDION) IV solution
100 | INTRAVENOUS | Status: AC | PRN
Start: 2020-04-19 — End: 2020-04-19
  Administered 2020-04-19: 16:00:00 200 via INTRAVENOUS

## 2020-04-19 MED ORDER — allopurinoL (ZYLOPRIM) tablet 300 mg
300 | Freq: Every day | ORAL | Status: AC
Start: 2020-04-19 — End: 2020-04-20
  Administered 2020-04-19 – 2020-04-20 (×2): 300 mg via ORAL

## 2020-04-19 MED ORDER — buPROPion XL (WELLBUTRIN XL) 24 hr tablet 150 mg
150 | Freq: Every morning | ORAL | Status: AC
Start: 2020-04-19 — End: 2020-04-20
  Administered 2020-04-19 – 2020-04-20 (×2): 150 mg via ORAL

## 2020-04-19 MED ORDER — senna-docusate (SENNA-S) 8.6-50 mg per tablet 1 tablet
8.6-50 | Freq: Two times a day (BID) | ORAL | Status: AC
Start: 2020-04-19 — End: 2020-04-20
  Administered 2020-04-19 – 2020-04-20 (×3): 1 via ORAL

## 2020-04-19 MED ORDER — lactated Ringers infusion
INTRAVENOUS | Status: AC
Start: 2020-04-19 — End: 2020-04-20
  Administered 2020-04-19 – 2020-04-20 (×3): 125 mL/h via INTRAVENOUS

## 2020-04-19 MED ORDER — ondansetron (ZOFRAN) injection 4 mg
4 | Freq: Three times a day (TID) | INTRAMUSCULAR | Status: AC | PRN
Start: 2020-04-19 — End: 2020-04-20

## 2020-04-19 MED ORDER — pantoprazole (PROTONIX) EC tablet 40 mg
40 | Freq: Every day | ORAL | Status: AC
Start: 2020-04-19 — End: 2020-04-20
  Administered 2020-04-19 – 2020-04-20 (×2): 40 mg via ORAL

## 2020-04-19 MED ORDER — oxyCODONE (ROXICODONE) immediate release tablet 5 mg
5 | ORAL | Status: AC | PRN
Start: 2020-04-19 — End: 2020-04-20

## 2020-04-19 MED ORDER — neostigmine methylsulfate (PROSTIGMIN) IV solution
1 | INTRAVENOUS | Status: AC | PRN
Start: 2020-04-19 — End: 2020-04-19
  Administered 2020-04-19: 16:00:00 3 via INTRAVENOUS

## 2020-04-19 MED ORDER — HYDROmorphone (DILAUDID) 0.5 mg/0.5 mL injection Syrg
0.5 | INTRAMUSCULAR | Status: AC
Start: 2020-04-19 — End: ?

## 2020-04-19 MED ORDER — rivastigmine tartrate (EXELON) capsule 6 mg
1.5 | Freq: Two times a day (BID) | ORAL | Status: AC
Start: 2020-04-19 — End: 2020-04-20
  Administered 2020-04-19 – 2020-04-20 (×3): 6 mg via ORAL

## 2020-04-19 MED ORDER — montelukast (SINGULAIR) tablet 10 mg
10 | Freq: Every morning | ORAL | Status: AC
Start: 2020-04-19 — End: 2020-04-20
  Administered 2020-04-19 – 2020-04-20 (×2): 10 mg via ORAL

## 2020-04-19 MED ORDER — heparin (porcine) injection 5,000 Units
5000 | Freq: Three times a day (TID) | INTRAMUSCULAR | Status: AC
Start: 2020-04-19 — End: 2020-04-20
  Administered 2020-04-19 – 2020-04-20 (×4): 5000 [IU] via SUBCUTANEOUS

## 2020-04-19 MED ORDER — finasteride (PROSCAR) tablet 5 mg
5 | Freq: Every day | ORAL | Status: AC
Start: 2020-04-19 — End: 2020-04-20
  Administered 2020-04-19 – 2020-04-20 (×2): 5 mg via ORAL

## 2020-04-19 MED ORDER — carbidopa-levodopa (SINEMET) 25-100 mg per tablet 3 tablet
25-100 | Freq: Four times a day (QID) | ORAL | Status: AC
Start: 2020-04-19 — End: 2020-04-20
  Administered 2020-04-19 – 2020-04-20 (×4): 3 via ORAL

## 2020-04-19 MED ORDER — HYDROmorphone (DILAUDID) injection Syrg 1 mg
1 | INTRAMUSCULAR | Status: AC | PRN
Start: 2020-04-19 — End: 2020-04-20

## 2020-04-19 MED ORDER — PARoxetine (PAXIL) tablet 30 mg
20 | Freq: Every morning | ORAL | Status: AC
Start: 2020-04-19 — End: 2020-04-20
  Administered 2020-04-19 – 2020-04-20 (×2): 30 mg via ORAL

## 2020-04-19 MED ORDER — ceFAZolin (ANCEF) 1 g in sodium chloride 0.9% 100 mL ADDaptor IVPB
Freq: Three times a day (TID) | INTRAVENOUS | Status: AC
Start: 2020-04-19 — End: 2020-04-20
  Administered 2020-04-19 – 2020-04-20 (×2): 1 g via INTRAVENOUS

## 2020-04-19 MED ORDER — belladonna-opium (B&O SUPPRETTES) 16.2-60 MG 1 suppository
16.2-60 | Freq: Four times a day (QID) | RECTAL | Status: AC | PRN
Start: 2020-04-19 — End: 2020-04-20

## 2020-04-19 MED ORDER — acetaminophen (TYLENOL) tablet 650 mg
325 | Freq: Four times a day (QID) | ORAL | Status: AC
Start: 2020-04-19 — End: 2020-04-20
  Administered 2020-04-19 – 2020-04-20 (×4): 650 mg via ORAL

## 2020-04-19 MED ORDER — lidocaine (PF) 2% (20 mg/mL) 20 mg/mL (2 %) Soln
20 | INTRAMUSCULAR | Status: AC
Start: 2020-04-19 — End: ?

## 2020-04-19 MED ORDER — glycopyrrolate (ROBINUL) injection
0.2 | INTRAMUSCULAR | Status: AC | PRN
Start: 2020-04-19 — End: 2020-04-19
  Administered 2020-04-19: 16:00:00 .4 via INTRAVENOUS

## 2020-04-19 MED ORDER — diphenhydrAMINE (BENADRYL) capsule 25 mg
25 | Freq: Four times a day (QID) | ORAL | Status: AC | PRN
Start: 2020-04-19 — End: 2020-04-20

## 2020-04-19 MED ORDER — diphenhydrAMINE (BENADRYL) capsule 50 mg
25 | Freq: Four times a day (QID) | ORAL | Status: AC | PRN
Start: 2020-04-19 — End: 2020-04-20

## 2020-04-19 MED ORDER — sodium chloride 0.9 % irrigation 3,000 mL
0.9 | Status: AC | PRN
Start: 2020-04-19 — End: 2020-04-20
  Administered 2020-04-20 (×3): 3000 mL

## 2020-04-19 MED ORDER — polyethylene glycol (MIRALAX) packet 17 g
17 | Freq: Every day | ORAL | Status: AC | PRN
Start: 2020-04-19 — End: 2020-04-20
  Administered 2020-04-19: 21:00:00 17 g via ORAL

## 2020-04-19 MED ORDER — oxyCODONE (ROXICODONE) immediate release tablet 10 mg
5 | ORAL | Status: AC | PRN
Start: 2020-04-19 — End: 2020-04-20
  Administered 2020-04-19: 21:00:00 10 mg via ORAL

## 2020-04-19 MED ORDER — esmoloL (BREVIBLOC) injection
100 | INTRAVENOUS | Status: AC | PRN
Start: 2020-04-19 — End: 2020-04-19
  Administered 2020-04-19: 15:00:00 20 via INTRAVENOUS
  Administered 2020-04-19: 16:00:00 10 via INTRAVENOUS

## 2020-04-19 MED ORDER — phenylephrine (NEO-SYNEPHRINE) injection
10 | INTRAMUSCULAR | Status: AC | PRN
Start: 2020-04-19 — End: 2020-04-19
  Administered 2020-04-19: 16:00:00 100 via INTRAVENOUS
  Administered 2020-04-19: 15:00:00 50 via INTRAVENOUS

## 2020-04-19 MED ORDER — metoprolol succinate (TOPROL-XL) 24 hr tablet 25 mg
25 | Freq: Every evening | ORAL | Status: AC
Start: 2020-04-19 — End: 2020-04-20
  Administered 2020-04-20: 02:00:00 25 mg via ORAL

## 2020-04-19 MED ORDER — esmoloL (BREVIBLOC) 100 mg/10 mL (10 mg/mL) injection
100 | INTRAVENOUS | Status: AC
Start: 2020-04-19 — End: ?

## 2020-04-19 MED ORDER — sugammadex (BRIDION) 100 mg/mL IV solution
100 | INTRAVENOUS | Status: AC
Start: 2020-04-19 — End: 2020-04-19

## 2020-04-19 MED ORDER — loratadine (CLARITIN) tablet 10 mg
10 | Freq: Every day | ORAL | Status: AC | PRN
Start: 2020-04-19 — End: 2020-04-20

## 2020-04-19 MED ORDER — lidocaine (PF) 20 mg/mL (2 %) Soln
20 | INTRAVENOUS | Status: AC | PRN
Start: 2020-04-19 — End: 2020-04-19
  Administered 2020-04-19: 15:00:00 100 via INTRAVENOUS
  Administered 2020-04-19: 16:00:00 120 via INTRAVENOUS

## 2020-04-19 MED ORDER — phenylephrine 100 mcg/mL in NS 0.9% 10 mL IV 100 mcg/ml syringe
100 | INTRAVENOUS | Status: AC
Start: 2020-04-19 — End: ?

## 2020-04-19 MED ORDER — HYDROmorphone (DILAUDID) injection 2 mg
2 | INTRAMUSCULAR | Status: AC | PRN
Start: 2020-04-19 — End: 2020-04-20

## 2020-04-19 MED ORDER — ceFAZolin (ANCEF) IVPB 2 g in D5W (duplex)
2 | INTRAVENOUS | Status: AC | PRN
Start: 2020-04-19 — End: 2020-04-19
  Administered 2020-04-19: 15:00:00 2 g via INTRAVENOUS

## 2020-04-19 MED ORDER — HYDROmorphone (DILAUDID) injection Syrg
2 | INTRAMUSCULAR | Status: AC | PRN
Start: 2020-04-19 — End: 2020-04-19
  Administered 2020-04-19: 15:00:00 .5 via INTRAVENOUS

## 2020-04-19 MED ORDER — ondansetron (ZOFRAN) injection
4 | INTRAMUSCULAR | Status: AC | PRN
Start: 2020-04-19 — End: 2020-04-19
  Administered 2020-04-19: 16:00:00 4 via INTRAVENOUS

## 2020-04-19 MED ORDER — polyethylene glycol (GLYCOLAX) powder 17 g
17 | ORAL | Status: AC | PRN
Start: 2020-04-19 — End: 2020-04-19

## 2020-04-19 MED ORDER — propofol 10 mg/ml (DIPRIVAN) injection
10 | INTRAVENOUS | Status: AC | PRN
Start: 2020-04-19 — End: 2020-04-19
  Administered 2020-04-19: 15:00:00 100 via INTRAVENOUS

## 2020-04-19 MED ORDER — rocuronium (ZEMURON) injection
10 | INTRAVENOUS | Status: AC | PRN
Start: 2020-04-19 — End: 2020-04-19
  Administered 2020-04-19: 16:00:00 10 via INTRAVENOUS
  Administered 2020-04-19: 15:00:00 40 via INTRAVENOUS
  Administered 2020-04-19: 16:00:00 10 via INTRAVENOUS

## 2020-04-19 MED ORDER — lactated Ringers infusion
INTRAVENOUS | Status: AC
Start: 2020-04-19 — End: 2020-04-19
  Administered 2020-04-19: 15:00:00 20 mL/h via INTRAVENOUS

## 2020-04-19 MED ORDER — dexamethasone (DECADRON) injection
4 | INTRAMUSCULAR | Status: AC | PRN
Start: 2020-04-19 — End: 2020-04-19
  Administered 2020-04-19: 15:00:00 4 via INTRAVENOUS

## 2020-04-19 MED FILL — HEPARIN (PORCINE) 5,000 UNIT/ML INJECTION SOLUTION: 5000 5,000 unit/mL | INTRAMUSCULAR | Qty: 1

## 2020-04-19 MED FILL — ALLOPURINOL 300 MG TABLET: 300 300 MG | ORAL | Qty: 1

## 2020-04-19 MED FILL — TYLENOL 325 MG TABLET: 325 325 mg | ORAL | Qty: 2

## 2020-04-19 MED FILL — LACTATED RINGERS INTRAVENOUS SOLUTION: 20.00 20.00 mL/hr | INTRAVENOUS | Qty: 1000

## 2020-04-19 MED FILL — CARBIDOPA 25 MG-LEVODOPA 100 MG TABLET: 25-100 25-100 mg | ORAL | Qty: 3

## 2020-04-19 MED FILL — LIDOCAINE (PF) 20 MG/ML (2 %) INJECTION SOLUTION: 20 20 mg/mL (2 %) | INTRAMUSCULAR | Qty: 5

## 2020-04-19 MED FILL — MONTELUKAST 10 MG TABLET: 10 10 mg | ORAL | Qty: 1

## 2020-04-19 MED FILL — BRIDION 100 MG/ML INTRAVENOUS SOLUTION: 100 100 mg/mL | INTRAVENOUS | Qty: 2

## 2020-04-19 MED FILL — OXYCODONE 5 MG TABLET: 5 5 MG | ORAL | Qty: 2

## 2020-04-19 MED FILL — FINASTERIDE 5 MG TABLET: 5 5 mg | ORAL | Qty: 1

## 2020-04-19 MED FILL — SENNOSIDES 8.6 MG-DOCUSATE SODIUM 50 MG TABLET: 8.6-50 8.6-50 mg | ORAL | Qty: 1

## 2020-04-19 MED FILL — RIVASTIGMINE 3 MG CAPSULE: 3 3 MG | ORAL | Qty: 2

## 2020-04-19 MED FILL — LACTATED RINGERS INTRAVENOUS SOLUTION: 125.00 125.00 mL/hr | INTRAVENOUS | Qty: 1000

## 2020-04-19 MED FILL — RIVASTIGMINE 1.5 MG CAPSULE: 1.5 1.5 MG | ORAL | Qty: 4

## 2020-04-19 MED FILL — BUPROPION HCL XL 150 MG 24 HR TABLET, EXTENDED RELEASE: 150 150 MG | ORAL | Qty: 1

## 2020-04-19 MED FILL — PANTOPRAZOLE 40 MG TABLET,DELAYED RELEASE: 40 40 MG | ORAL | Qty: 1

## 2020-04-19 MED FILL — ESMOLOL 100 MG/10 ML (10 MG/ML) INTRAVENOUS SOLUTION: 100 100 mg/10 mL (10 mg/mL) | INTRAVENOUS | Qty: 10

## 2020-04-19 MED FILL — CEFAZOLIN 1 GRAM SOLUTION FOR INJECTION: 1 1 g | INTRAMUSCULAR | Qty: 1000

## 2020-04-19 MED FILL — PHENYLEPHRINE 100 MCG/ML IN NSS 0.9% 10 ML IV SYRINGE: 100 100 mcg/ml | INTRAVENOUS | Qty: 10

## 2020-04-19 MED FILL — CEFAZOLIN 2 GRAM/50 ML IN DEXTROSE (ISO-OSMOTIC) INTRAVENOUS PIGGYBACK: 2 2 gram/50 mL | INTRAVENOUS | Qty: 50

## 2020-04-19 MED FILL — SODIUM CHLORIDE 0.9 % IRRIGATION SOLUTION: 0.9 0.9 % | Qty: 3000

## 2020-04-19 MED FILL — HYDROMORPHONE 0.5 MG/0.5 ML INJECTION SYRINGE: 0.5 0.5 mg/0.5 mL | INTRAMUSCULAR | Qty: 0.5

## 2020-04-19 MED FILL — POLYETHYLENE GLYCOL 3350 17 GRAM/DOSE ORAL POWDER: 17 17 gram/dose | ORAL | Qty: 238

## 2020-04-19 MED FILL — PAROXETINE 10 MG TABLET: 10 10 MG | ORAL | Qty: 1

## 2020-04-19 MED FILL — NASAL SPRAY (OXYMETAZOLINE) 0.05 %: 0.05 0.05 % | NASAL | Qty: 30

## 2020-04-19 MED FILL — METOPROLOL SUCCINATE ER 25 MG TABLET,EXTENDED RELEASE 24 HR: 25 25 MG | ORAL | Qty: 1

## 2020-04-19 MED FILL — POLYETHYLENE GLYCOL 3350 17 GRAM ORAL POWDER PACKET: 17 17 gram | ORAL | Qty: 1

## 2020-04-19 NOTE — Unmapped (Addendum)
[place]  DEPARTMENT OF ANESTHESIOLOGY  PRE-PROCEDURAL EVALUATION    William Russell is a 85 y.o. year old male presenting for:    Procedure(s):  Cystoscopy, Transurethral resection of the prostate    Surgeon:   [name], MD, PhD    Chief Complaint     Benign prostatic hyperplasia, unspecified whether lower urinary tract symp*    Review of Systems     Anesthesia Evaluation    Patient summary reviewed and nursing notes reviewed.       History of anesthetic complications   I have reviewed the History and Physical Exam, any relevant changes are noted in the anesthesia pre-operative evaluation.      Cardiovascular:    Exercise tolerance: poor  Duke Met score: 3 - Walking on a flat surface for one or two blocks.  (+) CAD and hyperlipidemia.  Hypertension is.    ROS comment: Left heart catheterization (2016):   left ventriculography, coronary arteriography.     Initially, radial procedure was to be performed. However, Freida Busman test was not normal. Therefore, right common femoral arterial access was obtained after usual subcutaneous lidocaine.     Single puncture. A 6-French short sheath.     Left heart catheterization, left ventriculography, coronary arteriography.     Left ventriculography showed a normal size left ventricle minimal inferoapical hypokinesia. 60% ejection fraction, 2 inferior wall diverticuli actually due to extensive trabeculation of the inferior wall of the left ventricle.     Prominent mitral leaflets.     Prominent coronary sinuses of Valsalva.     Right dominant system, but the left is the larger of the coronaries.     Right heart with minimal plaque disease. 30% mid.     Left main patent. LAD and circumflex with minimal plaque disease, no more than 30%.       stress test in 2016 which was abnormal Medium-sized inferior and basal inferolateral moderate ischemia. Normal LV cavity size with LVEDV of 79 ml post stress. Normal wall motion. LVEF 55 % post stress. This was followed by a LHC: no obstructive  disease.     Neuro/Muscoloskeletal/Psych:      Comments: Parkinson's disease    Pulmonary:    Sleep apnea.        GI/Hepatic/Renal:        Endo/Other:          Past Medical History     Past Medical History:   Diagnosis Date   ??? Benign prostatic hyperplasia    ??? BPH (benign prostatic hyperplasia)    ??? CPAP (continuous positive airway pressure) dependence     hasn't used for a few months d/t recall   ??? Depression    ??? Hearing aid worn    ??? High cholesterol    ??? HOH (hard of hearing)    ??? Hypertension    ??? OSA (obstructive sleep apnea)     uses CPAP   ??? Parkinson's disease (CMS Dx)    ??? PONV (postoperative nausea and vomiting)    ??? Wears glasses    ??? Wears hearing aid     bilat       Past Surgical History     Past Surgical History:   Procedure Laterality Date   ??? CARPAL TUNNEL RELEASE Left    ??? CHOLECYSTECTOMY     ??? CYSTOSCOPY URODYNAMICS N/A 10/15/2019    Procedure: URODYNAMICS;  Surgeon: Marcos Eke, CNP;  [facility] OR [facility];  Service: Urology;  Laterality:  N/A;   ??? LUMBAR SPINE SURGERY     ??? OTHER SURGICAL HISTORY Left     left ear surgery for otosclosis   ??? SHOULDER SURGERY Bilateral     right shoulder x2       Family History     Family History   Problem Relation Age of Onset   ??? Cancer Mother    ??? Coronary artery disease Mother    ??? Other Father    ??? Parkinsonism Neg Hx        Social History     Social History     Socioeconomic History   ??? Marital status: Married     Spouse name: Not on file   ??? Number of children: Not on file   ??? Years of education: Not on file   ??? Highest education level: Not on file   Occupational History   ??? Not on file   Tobacco Use   ??? Smoking status: Never Smoker   ??? Smokeless tobacco: Never Used   Substance and Sexual Activity   ??? Alcohol use: No   ??? Drug use: No     Comment: CBD oil   ??? Sexual activity: Not on file   Other Topics Concern   ??? Caffeine Use Yes   ??? Occupational Exposure No   ??? Exercise Yes   ??? Seat Belt Yes   Social History Narrative   ??? Not on file     Social  Determinants of Health     Financial Resource Strain: Not on file   Physical Activity: Not on file   Stress: Not on file   Social Connections: Not on file   Housing Stability: Not on file       Medications     Allergies:  Allergies   Allergen Reactions   ??? Oxybutynin Other (See Comments)     Hallucinations       Home Meds:  Prior to Admission medications as of 04/13/20 1603   Medication Sig Taking?   allopurinol (ZYLOPRIM) 300 MG tablet Take 300 mg by mouth daily.    aspirin 325 MG tablet Take 325 mg by mouth daily.    atorvastatin (LIPITOR) 20 MG tablet TAKE 1 TABLET BY MOUTH ONE TIME A DAY AT BEDTIME    buPROPion XL (WELLBUTRIN XL) 150 MG 24 hr tablet Take 150 mg by mouth every morning.           calcium citrate-vitamin D (CITRACAL+D) 315-200 mg-unit Take 1 tablet by mouth daily.    carbidopa-levodopa (SINEMET) 25-100 mg per tablet Take 3 tablets by mouth 4 times a day.    finasteride (PROSCAR) 5 mg tablet TAKE ONE TABLET BY MOUTH DAILY    folic acid (FOLVITE) 400 MCG tablet Take 400 mcg by mouth daily.    gabapentin (NEURONTIN) 100 MG capsule Take 1 capsule (100 mg total) by mouth at bedtime for 7 days, THEN 2 capsules (200 mg total) at bedtime for 7 days, THEN 3 capsules (300 mg total) at bedtime.    glucosamine-chondroitin 500-400 mg tablet Take 1 tablet by mouth daily.    hyalur ac/chond sul/colg II/AA (HYALURONIC ACID, CHOND-COLLGN, ORAL) Take 1 tablet by mouth every morning.    ibuprofen (MOTRIN) 200 MG tablet Take 200 mg by mouth if needed for Pain.           loratadine (CLARITIN) 10 mg tablet Take 10 mg by mouth daily as needed for Allergies.    metoprolol succinate (TOPROL-XL) 25 MG  24 hr tablet Take 25 mg by mouth at bedtime.           montelukast (SINGULAIR) 10 mg tablet Take 10 mg by mouth every morning.           multivit-min/FA/lycopen/lutein (CENTRUM SILVER MEN ORAL) Take by mouth.    mupirocin (BACTROBAN) 2 % ointment if needed.           niacin 100 MG tablet Take 500 mg by mouth daily with  breakfast.           omega-3 fatty acids-fish oil 300-1,000 mg capsule Take 1 capsule by mouth daily.    omeprazole (PRILOSEC) 20 MG capsule Take 20 mg by mouth daily.           oxymetazoline (AFRIN) 0.05 % nasal spray Use 2 sprays into each nostril 2 times a day.    paroxetine (PAXIL) 30 MG tablet Take 30 mg by mouth every morning.           polyethylene glycol (GLYCOLAX) 17 gram/dose powder Take 17 g by mouth daily.  Patient taking differently: Take 17 g by mouth if needed.           rivastigmine tartrate (EXELON) 6 MG capsule Take 1 capsule (6 mg total) by mouth 2 times a day.    tamsulosin (FLOMAX) 0.4 mg Cap TAKE ONE CAPSULE BY MOUTH EVERY NIGHT AT BEDTIME        Inpatient Meds:  Scheduled:   Continuous:   ??? lactated Ringers         PRN: ceFAZolin (ANCEF) IVPB    Vital Signs     Wt Readings from Last 3 Encounters:   04/13/20 184 lb (83.5 kg)   04/05/20 195 lb 4.8 oz (88.6 kg)   01/01/20 193 lb (87.5 kg)     Ht Readings from Last 3 Encounters:   04/13/20 5' 8 (1.727 m)   04/05/20 5' 8 (1.727 m)   01/01/20 5' 8 (1.727 m)     Temp Readings from Last 3 Encounters:   04/05/20 98.2 ??F (36.8 ??C) (Oral)   10/15/19 97.7 ??F (36.5 ??C) (Oral)     BP Readings from Last 3 Encounters:   04/13/20 143/71   04/05/20 105/56   01/01/20 104/64     Pulse Readings from Last 3 Encounters:   04/13/20 75   04/05/20 67   01/01/20 71     @LASTSAO2 (3)@    Physical Exam     Airway:     Mallampati: II  Mouth Opening: >2 FB  TM distance: > = 3 FB  Neck ROM: limited    Dental:   - No obvious cracked, loose, chipped, or missing teeth.     Pulmonary:   - normal exam    Breath sounds clear to auscultation.       Cardiovascular:  - normal exam   Rhythm: regular  Rate: normal    Neuro/Musculoskeletal/Psych:  - normal neurological exam.         Abdominal:       Current OB Status:       Other Findings:        Laboratory Data     Lab Results   Component Value Date    WBC 6.9 04/05/2020    HGB 12.8 (L) 04/05/2020    HCT 36.8 (L) 04/05/2020    MCV  94.0 04/05/2020    PLT 153 04/05/2020       No results found for: [facility]    Lab  Results   Component Value Date    GLUCOSE 96 04/05/2020    BUN 20 04/05/2020    CO2 29 04/05/2020    CREATININE 0.98 04/05/2020    K 3.9 04/05/2020    NA 138 04/05/2020    CL 103 04/05/2020    CALCIUM 8.9 04/05/2020       No results found for: PTT, INR    No results found for: PREGTESTUR, PREGSERUM, HCG, HCGQUANT    Anesthesia Plan     ASA 3         Current non-smoker and PONV    Anesthesia Type:  general endotracheal.      PONV Risk Factors: Hx of PONV/Motion Sickness, current non-smoker,                    Anesthetic plan and risks discussed with patient.    Plan, alternatives, and risks of anesthesia, including death, have been explained to and discussed with the patient/legal guardian.  By my assessment, the patient/legal guardian understands and agrees.  Scenario presented in detail.  Questions answered.    Use of blood products discussed with patient who consented to blood products.       Plan discussed with CRNA.

## 2020-04-19 NOTE — Unmapped (Signed)
OPERATIVE REPORT    Date of Operation: 04/19/2020    Preoperative Diagnosis:   1. BPH with LUTS    Postoperative Diagnosis:   1. same    Procedure:    1. Cystoscopy  2. Transurethral resection of prostate    Surgeon:   [name], MD, PhD    Assistants:   Harland Dingwall, MD    Anesthesia: General endotracheal anesthesia    Indications: William Russell is a 85 y.o. male who presents with BPH with LUTS. Informed consent was obtained and the risks, benefits, and details of the procedure were explained to the patient who elected to proceed.    Details of Procedure:  The patient was brought to the operating room and placed in the supine position on the operating room table. SCDs were placed on the lower extremities. Following induction of general anesthesia the patient was intubated without issue. Repositioned in dorsal lithotomy position. The patient was prepped and draped in the standard sterile fashion.    The 82 French resectoscope was assembled and advanced through the urethra and into the prostate. The ureteral orifices were visualized and were away from the area of vaporization. Cystoscopic findings demonstrated mild trilobar enlargement. The bipolar resectoscope loop was used to resect the prostate adenoma from the bladder neck to the verumontanum.  Bleeding was controlled with coagulation current. Attention was paid to stay proximal to the veru montanum at all times. The prostate was resected until a wide open channel was seen from the veru montanum to the bladder neck.  Adequate hemostasis was ensured. At the end of resection the ureteral orifices were intact. The resectoscope was withdrawn while keeping the bladder distended. A 3-Fr 3-way foley catheter was placed with return of clear urine. The balloon was inflated to 45 ml. The catheter was not placed to traction on the patient's thigh.      Findings: mild trilobar enlargement. UOs far from bladder neck.     Estimated Blood Loss: 100 ml     Drains:  24-Fr three-way foley          Specimens: Prostate chips    Complications: None           Disposition: Patient will be admitted overnight for continuous bladder irrigation. Will hold CBI in the morning if urine color clear and attempt voiding trial thereafter.           MICHAEL Virl Diamond, MD    I was present for the entire procedure and findings were discussed and agreed with resident as documented above.  Gwenlyn Perking Maye Parkinson

## 2020-04-19 NOTE — Unmapped (Addendum)
Pt admitted from PACU. A&Ox4. Foley in place with CBI in progress. Oriented to room. Fall and safety precaution in place. Call light within reach. Will continue to monitor pt.

## 2020-04-19 NOTE — Unmapped (Signed)
UROLOGY H&P NOTE    Patient: William Russell  Admit Date: 04/19/2020  Location: PeriOp/PeriOp    Chief Complaint: BPH with mixed LUTS    HPI: [name] is a 85 y.o. male with;     history of PD (03/2017), OSA, constipation, depression. Here for follow up   ??  BPH with LUTS  -Ditropan caused hallucinations. Trospium??and detrol??too expensive, ??insurance did not approve mybetriq, but has previously tried, without benefit  -Taking Vesicare with benefit??but??causing memory issues  -Taking Flomax and Proscar. Reports strong urinary stream and complete bladder emptying.????  -Daily urgency no leaks   -Frequency every 3-4 hours  -Nocturia- can sleep for 6 hours now, up x1  ??  Right epididymal cyst  -Right testicular swelling continuing, reports this has increased.??No pain. ??  -Denies dysuria, flank pain, fever, chills, urgency    Urodynamic Diagnosis on 9/21  Phasic DO with leak  Bladder Outlet Obstruction  Huston Foley Dyskinesia    Medical History:  Past Medical History:   Diagnosis Date   ??? Benign prostatic hyperplasia    ??? BPH (benign prostatic hyperplasia)    ??? CPAP (continuous positive airway pressure) dependence     hasn't used for a few months d/t recall   ??? Depression    ??? Hearing aid worn    ??? High cholesterol    ??? HOH (hard of hearing)    ??? Hypertension    ??? OSA (obstructive sleep apnea)     uses CPAP   ??? Parkinson's disease (CMS Dx)    ??? PONV (postoperative nausea and vomiting)    ??? Wears glasses    ??? Wears hearing aid     bilat       Past Surgical History:   Procedure Laterality Date   ??? CARPAL TUNNEL RELEASE Left    ??? CHOLECYSTECTOMY     ??? CYSTOSCOPY URODYNAMICS N/A 10/15/2019    Procedure: URODYNAMICS;  Surgeon: Marcos Eke, CNP;  Location: [facility] OR [facility];  Service: Urology;  Laterality: N/A;   ??? LUMBAR SPINE SURGERY     ??? OTHER SURGICAL HISTORY Left     left ear surgery for otosclosis   ??? SHOULDER SURGERY Bilateral     right shoulder x2       Medications:   Outpatient Meds:  Current Discharge Medication List       CONTINUE these medications which have NOT CHANGED    Details   allopurinol (ZYLOPRIM) 300 MG tablet Take 300 mg by mouth daily.      aspirin 325 MG tablet Take 325 mg by mouth daily.      atorvastatin (LIPITOR) 20 MG tablet TAKE 1 TABLET BY MOUTH ONE TIME A DAY AT BEDTIME  Refills: 3      buPROPion XL (WELLBUTRIN XL) 150 MG 24 hr tablet Take 150 mg by mouth every morning.             calcium citrate-vitamin D (CITRACAL+D) 315-200 mg-unit Take 1 tablet by mouth daily.      carbidopa-levodopa (SINEMET) 25-100 mg per tablet Take 3 tablets by mouth 4 times a day.  Qty: 1080 tablet, Refills: 3    Associated Diagnoses: Parkinson disease (CMS Dx)      finasteride (PROSCAR) 5 mg tablet TAKE ONE TABLET BY MOUTH DAILY  Qty: 30 tablet, Refills: 8      folic acid (FOLVITE) 400 MCG tablet Take 400 mcg by mouth daily.      gabapentin (NEURONTIN) 100 MG capsule Take  1 capsule (100 mg total) by mouth at bedtime for 7 days, THEN 2 capsules (200 mg total) at bedtime for 7 days, THEN 3 capsules (300 mg total) at bedtime.  Qty: 90 capsule, Refills: 11      glucosamine-chondroitin 500-400 mg tablet Take 1 tablet by mouth daily.      hyalur ac/chond sul/colg II/AA (HYALURONIC ACID, CHOND-COLLGN, ORAL) Take 1 tablet by mouth every morning.      ibuprofen (MOTRIN) 200 MG tablet Take 200 mg by mouth if needed for Pain.             loratadine (CLARITIN) 10 mg tablet Take 10 mg by mouth daily as needed for Allergies.      metoprolol succinate (TOPROL-XL) 25 MG 24 hr tablet Take 25 mg by mouth at bedtime.             montelukast (SINGULAIR) 10 mg tablet Take 10 mg by mouth every morning.             multivit-min/FA/lycopen/lutein (CENTRUM SILVER MEN ORAL) Take by mouth.      mupirocin (BACTROBAN) 2 % ointment if needed.             niacin 100 MG tablet Take 500 mg by mouth daily with breakfast.             omega-3 fatty acids-fish oil 300-1,000 mg capsule Take 1 capsule by mouth daily.      omeprazole (PRILOSEC) 20 MG capsule Take 20 mg by  mouth daily.             oxymetazoline (AFRIN) 0.05 % nasal spray Use 2 sprays into each nostril 2 times a day.      paroxetine (PAXIL) 30 MG tablet Take 30 mg by mouth every morning.             polyethylene glycol (GLYCOLAX) 17 gram/dose powder Take 17 g by mouth daily.  Qty: 510 g, Refills: 11      rivastigmine tartrate (EXELON) 6 MG capsule Take 1 capsule (6 mg total) by mouth 2 times a day.  Qty: 180 capsule, Refills: 3      tamsulosin (FLOMAX) 0.4 mg Cap TAKE ONE CAPSULE BY MOUTH EVERY NIGHT AT BEDTIME  Qty: 60 capsule, Refills: 5    Associated Diagnoses: Benign prostatic hyperplasia, unspecified whether lower urinary tract symptoms present             Inpatient Meds:  Scheduled:  Continuous:  ??? lactated Ringers       CVE:LFYBOFBPZ (ANCEF) IVPB    Allergies:   Allergies   Allergen Reactions   ??? Oxybutynin Other (See Comments)     Hallucinations       SH:   Social History     Tobacco Use   ??? Smoking status: Never Smoker   ??? Smokeless tobacco: Never Used   Substance Use Topics   ??? Alcohol use: No       FH: Noncontributory.    ROS: As per HPI. Otherwise negative.    Objective:  There were no vitals filed for this visit.     Physical Examination:  Gen: No apparent distress, alert and oriented x3  HEENT: Normocephalic, EOMI, mucous membranes pink and moist  CV: RRR  Chest: No respiratory distress  Abd: Soft, non-tender, non-distended   Ext: Warm and well perfused  Neuro: No focal deficits  GU: unchanged    Labs:  No results for input(s): WBC, HGB, HCT, PLT in the last 72 hours.  No results for input(s): NA, K, CL, CO2, BUN, CREATININE, GLUCOSE, CALCIUM, MG, PHOS in the last 72 hours.  No results for input(s): INR, PROTIME in the last 72 hours.  No results found for: PSA    Lab Results   Component Value Date    PROTEINUA Negative 08/02/2018    PHUR 7.0 08/02/2018    GLUCOSEU Negative 08/02/2018    BLOODU Negative 08/02/2018    LEUKOCYTESUR Negative 08/02/2018    NITRITE Negative 08/02/2018    BILIRUBINUR Negative  08/02/2018    UROBILINOGEN 0.2 08/02/2018       Imaging:   No results found.    Assessment:  PEARSON PICOU is a 85 y.o. male     With history of PD (03/2017), OSA, constipation, depression. Here for follow up   ??  BPH with LUTS  -Ditropan caused hallucinations. Trospium??and detrol??too expensive, ??insurance did not approve mybetriq, but has previously tried, without benefit  -Taking Vesicare with benefit??but??causing memory issues  -Taking Flomax and Proscar. Reports strong urinary stream and complete bladder emptying.????  -Daily urgency no leaks   -Frequency every 3-4 hours  -Nocturia- can sleep for 6 hours now, up x1  ??  Right epididymal cyst  -Right testicular swelling continuing, reports this has increased.??No pain. ??  -Denies dysuria, flank pain, fever, chills, urgency    Urodynamic Diagnosis on 9/21  Phasic DO with leak  Bladder Outlet Obstruction  Huston Foley Dyskinesia    Plan:  To OR for TURP  Ancef    Herchel Hopkin Virl Diamond, MD  04/19/2020

## 2020-04-19 NOTE — Unmapped (Signed)
Anesthesia Extubation Criteria:    Airway Device: endotracheal tube    Emergence Details:      Smooth      _x_      Stormy       __       Prolonged   __     Extubation Criteria:      Motor strength intact       _x_      Follows commands        _x_      Good airway reflexes      _x_      OP suctioned                  _x_        Follows commands:  Yes     Patient extubated:  Yes

## 2020-04-19 NOTE — Unmapped (Signed)
He is just now finished talking to and signing residents consent.  He is finally changed.

## 2020-04-19 NOTE — Unmapped (Signed)
Anesthesia Post Note    Patient: William Russell    Procedure(s) Performed: Procedure(s):  Cystoscopy, Transurethral resection of the prostate    Anesthesia type: general endotracheal    Patient location: PACU    Airway: Patent    Post pain: Adequate analgesia    Nausea / Vomiting: Absent    Post-operative Hydration Status: Adequate    Post assessment: no apparent anesthetic complications    Last Vitals:   Vitals:    04/19/20 1347 04/19/20 2042 04/20/20 0015 04/20/20 0406   BP: 121/66 120/69 125/72 126/68   BP Location: Left arm Left arm Left arm Left arm   Patient Position: Lying Sitting Lying Lying   Pulse: 74 99 89 72   Resp: 16 16 16 16    Temp: 97.5 ??F (36.4 ??C) 97.6 ??F (36.4 ??C) 98.3 ??F (36.8 ??C) 97.6 ??F (36.4 ??C)   TempSrc: Oral Oral Oral Oral   SpO2: 98% 92% 96% 98%   Weight:       Height:            Post vital signs: stable    Level of consciousness: awake    Complications:  There were no known complications for this encounter.

## 2020-04-19 NOTE — Unmapped (Signed)
Anesthesia Transfer of Care Note    Patient: William Russell  Procedure(s) Performed: Procedure(s):  Cystoscopy, Transurethral resection of the prostate    Patient location: PACU    Anesthesia type: general endotracheal    Airway Device on Arrival to PACU/ICU: Room Air    IV Access: Peripheral    Monitors Recommended to be Used During PACU/ICU: Standard Monitors    Outstanding Issues to Address: None    Level of Consciousness: awake    Post vital signs:    Vitals:    04/19/20 1124   BP: 126/62   Pulse: 87   Resp: 14   Temp: 98.8 ??F (37.1 ??C)   SpO2: 94%       Complications:  No complications documented.

## 2020-04-19 NOTE — Unmapped (Signed)
Report called to charge nurse, Chari Manning RN on 4 PCT.  Pt inpatient room being cleaned at this time.  Pt updated.

## 2020-04-19 NOTE — Unmapped (Signed)
INTRA-OP POST BRIEFING NOTE: William Russell      Specimens:   Specimens     ID Source Type Tests Collected By Collected At Frozen? Attributes Order ID Breast Spec Formalin Marked as Sent    A Prostate Tissue ?? SURGICAL PATHOLOGY EXAM   [name], MD, PhD 04/19/20 1048  Sent in Formalin ?? [id number]        Comment: prostate tissue          Prior to leaving the room: Nurse confirmed name of procedure, completion of instrument, sponge & needle counts, reads specimen labels aloud including patient name and addresses any equipment issues? Nurse confirmed wound class. Nurse to surgeon and anesthesia: What are key concerns for recovery and management of the patient?  Yes      Blood products stored at appropriate temperatures prior to return to blood bank (if applicable)? N/A      Patient identification band secured on patient prior to transfer out of the operating room? Yes    Temporary devices implanted for the duration of the surgery removed and evaluated for intactness and completeness prior to closure? N/A      Other Comments:     Signed: Geanna Divirgilio    Date: 04/19/2020    Time: 10:55 AM

## 2020-04-19 NOTE — Unmapped (Signed)
Problem: Acute Pain  Description: Patient's pain progressing toward patient's stated pain goal  Goal: Patient displays improved well-being such as baseline levels for pulse, BP, respirations and relaxed muscle tone or body posture  Outcome: Progressing  Goal: Patient will manage pain with the appropriate technique/intervention  Description: Assess and monitor patient's pain using appropriate pain scale. Collaborate with interdisciplinary team and initiate plan and interventions as ordered.  Re-assess patient's pain level 30-60 minutes after pain management intervention.  Outcome: Progressing  Goal: Patient will reduce or eliminate use of analgesics  Outcome: Progressing  Goal: Patients pain is managed to allow active participation in daily activities  Outcome: Progressing  Goal: Patient verbalizes a reduction in pain level  Outcome: Progressing  Goal: Discharge Pain Management Plan (Acute Pain)  Outcome: Progressing

## 2020-04-19 NOTE — Unmapped (Signed)
Pt is doing well.  VSS.  CBI continuing at this time. Pt tolerating po intake without N/V.  Pt denies pain at this time.  Dr Monte Fantasia notified that patient is ready for sign out of pacu.

## 2020-04-20 LAB — BASIC METABOLIC PANEL
Anion Gap: 7 mmol/L (ref 3–16)
BUN: 11 mg/dL (ref 7–25)
CO2: 29 mmol/L (ref 21–33)
Calcium: 8.7 mg/dL (ref 8.6–10.3)
Chloride: 104 mmol/L (ref 98–110)
Creatinine: 0.86 mg/dL (ref 0.60–1.30)
EGFR: 86
Glucose: 103 mg/dL — ABNORMAL HIGH (ref 70–100)
Osmolality, Calculated: 290 mosm/kg (ref 278–305)
Potassium: 4 mmol/L (ref 3.5–5.3)
Sodium: 140 mmol/L (ref 133–146)

## 2020-04-20 LAB — CBC
Hematocrit: 33.2 % — ABNORMAL LOW (ref 38.5–50.0)
Hemoglobin: 11.9 g/dL — ABNORMAL LOW (ref 13.2–17.1)
MCH: 33.2 pg — ABNORMAL HIGH (ref 27.0–33.0)
MCHC: 35.9 g/dL (ref 32.0–36.0)
MCV: 92.4 fL (ref 80.0–100.0)
MPV: 8 fL (ref 7.5–11.5)
Platelets: 155 10E3/uL (ref 140–400)
RBC: 3.6 10E6/uL — ABNORMAL LOW (ref 4.20–5.80)
RDW: 13.3 % (ref 11.0–15.0)
WBC: 10.9 10E3/uL — ABNORMAL HIGH (ref 3.8–10.8)

## 2020-04-20 MED ORDER — senna-docusate (SENNA-S) 8.6-50 mg per tablet
8.6-50 | ORAL_TABLET | Freq: Two times a day (BID) | ORAL | 0 refills | Status: AC
Start: 2020-04-20 — End: 2020-07-19
  Filled 2020-04-20: qty 30, 15d supply, fill #0

## 2020-04-20 MED ORDER — acetaminophen (TYLENOL) 325 MG tablet
325 | ORAL_TABLET | ORAL | 0 refills | 11.00000 days | Status: AC | PRN
Start: 2020-04-20 — End: 2020-07-19
  Filled 2020-04-20: qty 30, 3d supply, fill #0

## 2020-04-20 MED FILL — SENNOSIDES 8.6 MG-DOCUSATE SODIUM 50 MG TABLET: 8.6-50 8.6-50 mg | ORAL | Qty: 1

## 2020-04-20 MED FILL — CARBIDOPA 25 MG-LEVODOPA 100 MG TABLET: 25-100 25-100 mg | ORAL | Qty: 3

## 2020-04-20 MED FILL — CEFAZOLIN 1 GRAM SOLUTION FOR INJECTION: 1 1 g | INTRAMUSCULAR | Qty: 1000

## 2020-04-20 MED FILL — FINASTERIDE 5 MG TABLET: 5 5 mg | ORAL | Qty: 1

## 2020-04-20 MED FILL — PAROXETINE 10 MG TABLET: 10 10 MG | ORAL | Qty: 1

## 2020-04-20 MED FILL — TYLENOL 325 MG TABLET: 325 325 mg | ORAL | Qty: 2

## 2020-04-20 MED FILL — HEPARIN (PORCINE) 5,000 UNIT/ML INJECTION SOLUTION: 5000 5,000 unit/mL | INTRAMUSCULAR | Qty: 1

## 2020-04-20 MED FILL — RIVASTIGMINE 1.5 MG CAPSULE: 1.5 1.5 MG | ORAL | Qty: 4

## 2020-04-20 MED FILL — LACTATED RINGERS INTRAVENOUS SOLUTION: 125.00 125.00 mL/hr | INTRAVENOUS | Qty: 1000

## 2020-04-20 MED FILL — ALLOPURINOL 300 MG TABLET: 300 300 MG | ORAL | Qty: 1

## 2020-04-20 MED FILL — SODIUM CHLORIDE 0.9 % IRRIGATION SOLUTION: 0.9 0.9 % | Qty: 3000

## 2020-04-20 MED FILL — PANTOPRAZOLE 40 MG TABLET,DELAYED RELEASE: 40 40 MG | ORAL | Qty: 1

## 2020-04-20 MED FILL — BUPROPION HCL XL 150 MG 24 HR TABLET, EXTENDED RELEASE: 150 150 MG | ORAL | Qty: 1

## 2020-04-20 MED FILL — ATORVASTATIN 20 MG TABLET: 20 20 MG | ORAL | Qty: 1

## 2020-04-20 MED FILL — MONTELUKAST 10 MG TABLET: 10 10 mg | ORAL | Qty: 1

## 2020-04-20 NOTE — Unmapped (Signed)
Pt. discharged home in stable condition. Voided without difficulty.  IV removed. Discharge education and prescriptions reviewed with patient. Pain controlled and all questions and concerns addressed prior to discharge. Pt verbalizes understanding of discharge instructions, follow up care and new medication information. Pt. escorted out via wheelchair by PCA with all personal belongings in hand.

## 2020-04-20 NOTE — Unmapped (Signed)
UROLOGY DISCHARGE SUMMARY    Patient ID:  MALIEK SCHELLHORN  [id number]  23-Dec-1935    Admit date: 04/19/2020    Discharge date: 04/20/2020    Attending Physician: [name], MD, PhD     Admission Diagnoses:   Benign prostatic hyperplasia, unspecified whether lower urinary tract symptoms present [N40.0]    Discharge Diagnoses:   Active Problems:    * No active hospital problems. *    Past Medical History:   Diagnosis Date   ??? Benign prostatic hyperplasia    ??? BPH (benign prostatic hyperplasia)    ??? CPAP (continuous positive airway pressure) dependence     hasn't used for a few months d/t recall   ??? Depression    ??? Hearing aid worn    ??? High cholesterol    ??? HOH (hard of hearing)    ??? Hypertension    ??? OSA (obstructive sleep apnea)     uses CPAP   ??? Parkinson's disease (CMS Dx)    ??? PONV (postoperative nausea and vomiting)    ??? Wears glasses    ??? Wears hearing aid     bilat       Indication for Admission: William Russell is a 85 y.o. male with hx of BPH with LUTS. Options were discussed with pt and he decided to undergo surgical intervention.     Operations/Procedures Performed: TURP- Transurethral resection of the prostate     Hospital Course: Patient admitted on 04/19/2020 and underwent abovementioned procedure(s) on 04/19/2020. Tolerated the procedure well with no complications. Please see full operative report for further details regarding the operation. Postoperatilvely transferred to the floor  in stable condition. Pain controlled post-op with po pain meds. Foley removed on POD 1 and voided spontaneously. Diet was advanced and tolerated this well. At time of discharge, the patient was tolerating oral food and hydration, voiding spontaneously, had return of bowel function, was ambulating without difficulty, and pain was controlled on oral medications. The patient was determined to be suitable for discharge and the patient felt comfortable with that decision.     Consults: None    Significant Diagnostic Studies:  Labs    Discharge Exam:  Blood pressure 126/63, pulse 61, temperature 97.9 ??F (36.6 ??C), temperature source Oral, resp. rate 18, height 5' 8 (1.727 m), weight 180 lb 9.6 oz (81.9 kg), SpO2 99 %.    Gen - NAD, AOx3  CV - RRR  Resp - CTAB  Abd - soft, NT/ND  Ext - warm, well-perfused  GU - Pt voiding after foley removed     Disposition: Discharged home in good condition    Discharge Medications:  Current Discharge Medication List      START taking these medications    Details   acetaminophen (TYLENOL) 325 MG tablet Take 2 tablets (650 mg total) by mouth every 4 hours as needed. Indications: Pain  Qty: 30 tablet, Refills: 0      senna-docusate (SENNA-S) 8.6-50 mg per tablet Take 1 tablet by mouth 2 times a day.  Qty: 30 tablet, Refills: 0         CONTINUE these medications which have NOT CHANGED    Details   allopurinol (ZYLOPRIM) 300 MG tablet Take 300 mg by mouth daily.      aspirin 325 MG tablet Take 325 mg by mouth daily.      atorvastatin (LIPITOR) 20 MG tablet TAKE 1 TABLET BY MOUTH ONE TIME A DAY AT BEDTIME  Refills: 3  buPROPion XL (WELLBUTRIN XL) 150 MG 24 hr tablet Take 150 mg by mouth every morning.             calcium citrate-vitamin D (CITRACAL+D) 315-200 mg-unit Take 1 tablet by mouth daily.      carbidopa-levodopa (SINEMET) 25-100 mg per tablet Take 3 tablets by mouth 4 times a day.  Qty: 1080 tablet, Refills: 3    Associated Diagnoses: Parkinson disease (CMS Dx)      folic acid (FOLVITE) 400 MCG tablet Take 400 mcg by mouth daily.      gabapentin (NEURONTIN) 100 MG capsule Take 1 capsule (100 mg total) by mouth at bedtime for 7 days, THEN 2 capsules (200 mg total) at bedtime for 7 days, THEN 3 capsules (300 mg total) at bedtime.  Qty: 90 capsule, Refills: 11      glucosamine-chondroitin 500-400 mg tablet Take 1 tablet by mouth daily.      hyalur ac/chond sul/colg II/AA (HYALURONIC ACID, CHOND-COLLGN, ORAL) Take 1 tablet by mouth every morning.      ibuprofen (MOTRIN) 200 MG tablet Take 200 mg by  mouth if needed for Pain.             metoprolol succinate (TOPROL-XL) 25 MG 24 hr tablet Take 25 mg by mouth at bedtime.             montelukast (SINGULAIR) 10 mg tablet Take 10 mg by mouth every morning.             multivit-min/FA/lycopen/lutein (CENTRUM SILVER MEN ORAL) Take by mouth.      niacin 100 MG tablet Take 500 mg by mouth daily with breakfast.             omega-3 fatty acids-fish oil 300-1,000 mg capsule Take 1 capsule by mouth daily.      omeprazole (PRILOSEC) 20 MG capsule Take 20 mg by mouth daily.             oxymetazoline (AFRIN) 0.05 % nasal spray Use 2 sprays into each nostril 2 times a day.      paroxetine (PAXIL) 30 MG tablet Take 30 mg by mouth every morning.             rivastigmine tartrate (EXELON) 6 MG capsule Take 1 capsule (6 mg total) by mouth 2 times a day.  Qty: 180 capsule, Refills: 3      loratadine (CLARITIN) 10 mg tablet Take 10 mg by mouth daily as needed for Allergies.      mupirocin (BACTROBAN) 2 % ointment if needed.             polyethylene glycol (GLYCOLAX) 17 gram/dose powder Take 17 g by mouth daily.  Qty: 510 g, Refills: 11      tamsulosin (FLOMAX) 0.4 mg Cap TAKE ONE CAPSULE BY MOUTH EVERY NIGHT AT BEDTIME  Qty: 60 capsule, Refills: 5    Associated Diagnoses: Benign prostatic hyperplasia, unspecified whether lower urinary tract symptoms present         STOP taking these medications       finasteride (PROSCAR) 5 mg tablet Comments:   Reason for Stopping:               Patient Instructions:  See discharge instructions     Follow-Up:  Future Appointments   Date Time Provider Department Center   07/19/2020 10:40 AM Lenn Sink, CNP [facility] NEUR [facility]   11/16/2020 10:30 AM Virgina Organ, MD [facility] NEUR [facility]  Gretel Acre Vickey Sages  04/20/2020

## 2020-04-20 NOTE — Unmapped (Signed)
Urology Progress Note    Name: William Russell  Admit Date: 04/19/2020  OR Date: 04/19/2020    Subjective:   Patient seen and examined this morning. No acute events over night. S/p TURP. VSS, Afebrile. Pt tolerating clears. Foley in place with CBI infusing. Pt denies any pain. Labs stable      Objective:   Vitals:  Temp:  [97.5 ??F (36.4 ??C)-98.8 ??F (37.1 ??C)] 97.9 ??F (36.6 ??C)  Heart Rate:  [61-99] 61  Resp:  [11-18] 18  BP: (96-126)/(53-89) 126/63  FiO2:  [52 %-92 %] 90 %    Intake/Output:  Date 04/19/20 0700 - 04/20/20 0659 04/20/20 0700 - 04/21/20 0659   Shift 0700-1459 1500-2259 2300-0659 24 Hour Total 0700-1459 1500-2259 2300-0659 24 Hour Total   INTAKE   P.O. 120  0 120 240   240     P.O. 120  0 120 240   240   Shift Total(mL/kg) 120(1.5)  0(0) 120(1.5) 240(2.9)   240(2.9)   OUTPUT   Urine(mL/kg/hr) -650(-1) 2050(3.1) 2050(3.1) 3450(1.8)         CBI Net Output (mL) -650 2050 2050 3450       Shift Total(mL/kg) -650(-7.9) 1610(96) 0454(09) 3450(42.1)       Weight (kg) 81.9 81.9 81.9 81.9 81.9 81.9 81.9 81.9       Physical Exam:  Gen: Alert and oriented x3, no acute distress  HEENT: NCAT  CV: Regular rate and rhythm  Resp: No respiratory distress  Abd: Soft, non-distended, non-tender  Ext: Warm and well perfused  GU: Foley in place with CBI infusing with clear urine     Labs:  Recent Labs     04/19/20  1146 04/20/20  0449   WBC 5.5 10.9*   HGB 11.9* 11.9*   HCT 34.2* 33.2*   PLT 152 155     Recent Labs     04/19/20  1146 04/20/20  0449   NA 139 140   K 3.9 4.0   CL 104 104   CO2 29 29   BUN 15 11   CREATININE 0.83 0.86   GLUCOSE 117* 103*   CALCIUM 8.5* 8.7       Current Medications:  Scheduled Meds:   ??? acetaminophen  650 mg Oral QID   ??? allopurinoL  300 mg Oral Daily 0900   ??? atorvastatin  20 mg Oral Nightly (2100)   ??? buPROPion XL  150 mg Oral QAM   ??? carbidopa-levodopa  3 tablet Oral 4x daily   ??? finasteride  5 mg Oral Daily 0900   ??? heparin  5,000 Units Subcutaneous 3 times per day   ??? metoprolol succinate  25 mg  Oral Nightly (2100)   ??? montelukast  10 mg Oral QAM   ??? oxymetazoline  2 spray Each Nare BID   ??? pantoprazole  40 mg Oral DAILY 0600   ??? paroxetine  30 mg Oral QAM   ??? rivastigmine tartrate  6 mg Oral BID   ??? senna-docusate  1 tablet Oral BID       Continuous Infusions:   ??? lactated Ringers 125 mL/hr (04/20/20 0653)       PRN Meds: belladonna-opium, diphenhydrAMINE **OR** diphenhydrAMINE, HYDROmorphone **OR** HYDROmorphone, loratadine, ondansetron, oxyCODONE **OR** oxyCODONE, polyethylene glycol, sodium chloride    Assessment/Plan:   William Russell is a 85 y.o. male 1 Day Post-Op s/p TURP     1. CBI stopped and urine clear. Foley removed after 10 minutes   2. Voiding  trial - Bladder scan pt each time he voids to ensure pt is emptying his bladder   3. Advance diet  4. D/c planning   5. Call with questions    Discussed with staff [name], MD, PhD    Gretel Acre. Vickey Sages, CNP  Urology  Pager: [phone]

## 2020-04-20 NOTE — Unmapped (Signed)
Problem: Pain  Goal: Patient's pain is progressing toward patient's stated pain goal  Description: Assess and monitor patient's pain using appropriate pain scale. Collaborate with interdisciplinary team and initiate plan and interventions as ordered. Re-assess patient's pain level 30 - 60 minutes after pain management intervention.   Outcome: Progressing     Problem: Safety  Goal: Patient will be injury free during hospitalization  Description: Assess and monitor vitals signs, neurological status including level of consciousness and orientation. Assess patient's risk for falls and implement fall prevention plan of care and interventions per hospital policy.      Ensure arm band on, uncluttered walking paths in room, adequate room lighting, call light and overbed table within reach, bed in low position, wheels locked, side rails up per policy, and non-skid footwear provided.   Outcome: Progressing     Problem: Fall Prevention  Goal: Patient will remain free of falls  Description: Assess and monitor vitals signs, neurological status including level of consciousness and orientation.  Reassess fall risk per hospital policy.    Ensure arm band on, uncluttered walking paths in room, adequate room lighting, call light and overbed table within reach, bed in low position, wheels locked, side rails up per policy (excluding SNF), and non-skid footwear provided.   Outcome: Progressing     Problem: Daily Care  Goal: Daily care needs are met  Description: Assess and monitor ability to perform self care and identify potential discharge needs.  Outcome: Progressing

## 2020-04-20 NOTE — Unmapped (Signed)
Pharmacy Meds to [facility]  [place] [facility]    The following prescriptions for outpatient use have been delivered to [facility] room:  acetaminophen (TYLENOL) 325 MG tablet [[id number]]     senna-docusate (SENNA-S) 8.6-50 mg per tablet [[id number]]       ???     Other notes:  ???     Please contact outpatient pharmacy Monday-Friday 0900-1700 at [phone] with any questions or concerns.    Edythe Clarity, RPh  04/20/2020 2:37 PM  [phone]

## 2020-04-20 NOTE — Unmapped (Signed)
[facility]    Case Manager/Social Worker Discharge Summary     Patient name: William Russell                                        Patient MRN: [id number]  DOB: [birth year]                              Age: 85 y.o.              Gender: male  Patient emergency contact: Extended Emergency Contact Information  Primary Emergency Contact: Acrey,[name]  Address: [address]           [place], [place] [address] [place]  Home Phone: [phone]  Mobile Phone: [phone]  Relation: Spouse  Secondary Emergency Contact: helsinger,kristen  Work Phone: [phone]  Relation: Daughter      Attending provider: [name], MD, PhD  Primary care physician: Osborn Coho, MD    The MD has indicated that the patient is ready for discharge. CM met with patient, spouse and daughter at bedside to discuss discharge plans. Patient denies need for [facility] services. Patient confirmed he will have assistance at home.     Yvone Neu will return home with no discharge needs identified.  The patient will be transported by his family today per private vehicle.  Transfer Mode/Level of Care: Family    The plan has been reviewed:     Patient/Family Informed of Discharge Plan: Yes    Plan Reviewed With Patient, Family, or Significant Other: Yes    Patient and or family are aware and in agreement with the discharge plan: Yes             Plan reviewed with MD and other members of the health care team: Yes  Care Plan Completed: Yes    No further CM/SW needs.    Jocelyn Lamer RN, CM  [phone]    This plan has been reviewed with the multi-disciplinary team.     Post-Discharge Goals    Patient's Post-Discharge goals: to go home

## 2020-04-20 NOTE — Unmapped (Addendum)
Transurethral Vaporization of Prostate, Care After  This sheet gives you information about how to care for yourself after your procedure. Your health care provider may also give you more specific instructions. If you have problems or questions, contact your health care provider.  What can I expect after the procedure?  After the procedure, it is common to have:  ?? Pain or discomfort around your penis.  ?? Blood in your urine.  ?? Burning during urination.  ?? Sudden urges to urinate.  ?? Little or no semen produced during ejaculation (retrograde ejaculation).  Follow these instructions at home:  Medicines    ?? Take over-the-counter and prescription medicines only as told by your health care provider.  ?? If you were prescribed an antibiotic medicine, take it as told by your health care provider. Do not stop taking the antibiotic even if you start to feel better.  Activity  ?? Do not drive for 24 hours if you were given a medicine to help you relax (sedative) during your procedure.  ?? Do not drive or use heavy machinery while taking prescription pain medicine.  ?? Do not resume sexual activity until your health care provider approves. Ask your health care provider:  ? What activities are safe for you.  ? When you may return to your normal activities.  ?? Do not lift anything that is heavier than 10 lb (4.5 kg), or the limit that you are told, until your health care provider says that it is safe.  General instructions    ?? Drink enough fluid to keep your urine clear or pale yellow.  ?? To prevent or treat constipation while you are taking prescription pain medicine, your health care provider may recommend that you:  ? Take over-the-counter or prescription medicines.  ? Eat foods that are high in fiber, such as fresh fruits and vegetables, whole grains, and beans.  ? Limit foods that are high in fat and processed sugars, such as fried and sweet foods.  ?? If you go home with a tube draining your urine (urinary catheter), care  for your catheter as told by your health care provider. This may include:  ? Washing your hands before and after you touch the catheter.  ? Keeping the area around the catheter clean and dry.  ? Emptying the catheter bag when it is full and monitoring the amount and color of your urine.  ? Avoiding bending or breaking the catheter.  ? Keeping air out of the catheter.  ? Not putting the catheter underwater.  ? Visiting your health care provider to have the catheter removed.  ?? Keep all follow-up visits as told by your health care provider. This is important.  Contact a health care provider if:  ?? You have:  ? A fever or chills.  ? Trouble urinating or controlling when you urinate.  ? More blood in your urine instead of less.  ? Problems getting an erection.  ?? You start to pass blood clots in your urine.  ?? You have nausea or you vomit.  ?? There is more redness, swelling, or pain in your penis.  Get help right away if:  ?? You have bright red urine.  ?? You cannot urinate.  ?? You have severe pain that does not get better with medicine.  ?? You have shortness of breath.  Summary  ?? After this procedure, it is common to have some blood in your urine. If you see bright red blood in your urine,  however, you should get medical help right away.  ?? This procedure may cause you to produce little or no semen when ejaculating (retrograde ejaculation).  ?? Follow restrictions about lifting and sexual activity as told by your health care provider. Ask what activities are safe for you.  ?? Drink enough fluid to keep your urine clear or pale yellow.  This information is not intended to replace advice given to you by your health care provider. Make sure you discuss any questions you have with your health care provider.  Document Revised: 01/12/2017 Document Reviewed: 04/12/2016  Elsevier Patient Education ?? 2021 Elsevier Inc[employer]    Transurethral Resection of the Prostate  Transurethral resection of the prostate (TURP) is the removal  (resection) of part of the gland that produces semen (prostate gland). This procedure is done to treat benign prostatic hyperplasia (BPH). BPH is an abnormal, noncancerous (benign) increase in the number of cells that make up the prostate tissue. BPH causes the prostate to get bigger. The enlarged prostate can push against or block the tube that drains urine from the bladder out of the body (urethra). BPH can affect normal urine flow by causing bladder infections, difficulty controlling bladder function, and difficulty emptying the bladder.   The goal of TURP is to remove enough prostate tissue to allow for a normal flow of urine. The procedure will allow you to empty your bladder more completely when you urinate so that you can urinate less often.  In a transurethral resection, a thin telescope with a light, a tiny camera, and an electric cutting edge (resectoscope) is passed through the urethra and into the prostate. The opening of the urethra is at the end of the penis.  Tell a health care provider about:  ?? Any allergies you have.  ?? All medicines you are taking, including vitamins, herbs, eye drops, creams, and over-the-counter medicines.  ?? Any problems you or family members have had with anesthetic medicines.  ?? Any blood disorders you have.  ?? Any surgeries you have had.  ?? Any medical conditions you have.  ?? Any prostate infections you have had.  What are the risks?  Generally, this is a safe procedure. However, problems may occur, including:  ?? Infection.  ?? Bleeding.  ?? Allergic reactions to medicines.  ?? Damage to other structures or organs, such as:  ? The urethra.  ? The bladder.  ? Muscles that surround the prostate.  ?? Difficulty getting an erection.  ?? Inability to control when you urinate (incontinence).  ?? Scarring, which may cause problems with urine flow.  What happens before the procedure?  Medicines  Ask your health care provider about:  ?? Changing or stopping your regular medicines. This is  especially important if you are taking diabetes medicines or blood thinners.  ?? Taking medicines such as aspirin and ibuprofen. These medicines can thin your blood. Do not take these medicines unless your health care provider tells you to take them.  ?? Taking over-the-counter medicines, vitamins, herbs, and supplements.  Eating and drinking  Follow instructions from your health care provider about eating and drinking, which may include:  ?? 8 hours before the procedure - stop eating heavy meals or foods, such as meat, fried foods, or fatty foods.  ?? 6 hours before the procedure - stop eating light meals or foods, such as toast or cereal.  ?? 6 hours before the procedure - stop drinking milk or drinks that contain milk.  ?? 2 hours before  the procedure - stop drinking clear liquids.  Staying hydrated  Follow instructions from your health care provider about hydration, which may include:  ?? Up to 2 hours before the procedure - you may continue to drink clear liquids, such as water, clear fruit juice, black coffee, and plain tea.  General instructions  ?? You may have a physical exam.  ?? You may have a blood or urine sample taken.  ?? Ask your health care provider what steps will be taken to help prevent infection. These may include:  ? Washing skin with a germ-killing soap.  ? Taking antibiotic medicine.  ?? Plan to have someone take you home from the hospital or clinic. You may not be able to drive for up to 10 days after your procedure.  ?? Plan to have a responsible adult care for you for at least 24 hours after you leave the hospital or clinic. This is important.  What happens during the procedure?    ?? An IV will be inserted into one of your veins.  ?? You will be given one or more of the following:  ? A medicine to help you relax (sedative).  ? A medicine to make you fall asleep (general anesthetic).  ? A medicine that is injected into your spine to numb the area below and slightly above the injection site (spinal  anesthetic).  ?? Your legs will be placed in foot rests (stirrups) so that your legs are apart and your knees are bent.  ?? The resectoscope will be passed through your urethra to your prostate.  ?? Parts of your prostate will be resected using the cutting edge of the resectoscope.  ?? The resectoscope will be removed.  ?? A small, thin tube (catheter) will be passed through your urethra and into your bladder. The catheter will drain urine into a bag outside of your body.  ? Fluid may be passed through the catheter to keep the catheter open.  The procedure may vary among health care providers and hospitals.  What happens after the procedure?  ?? Your blood pressure, heart rate, breathing rate, and blood oxygen level will be monitored until you leave the hospital or clinic.  ?? You may continue to receive fluids and medicines through an IV.  ?? You may have some pain. Pain medicine will be available to help you.  ?? You will have a catheter draining your urine.  ? You may have blood in your urine. Your catheter may be kept in until your urine is clear.  ? Your urinary drainage will be monitored. If necessary, your bladder may be rinsed out (irrigated) through your catheter.  ?? You will be encouraged to walk around as soon as possible.  ?? You may have to wear compression stockings. These stockings help prevent blood clots and reduce swelling in your legs.  ?? Do not drive for 24 hours if you were given a sedative during your procedure.  Summary  ?? Transurethral resection of the prostate (TURP) is the removal (resection) of part of the gland that produces semen (prostate gland).  ?? The goal of this procedure is to remove enough prostate tissue to allow for a normal flow of urine.  ?? Follow instructions from your health care provider about taking medicines and about eating and drinking before the procedure.  This information is not intended to replace advice given to you by your health care provider. Make sure you discuss any  questions you have with your health  care provider.  Document Revised: 05/22/2018 Document Reviewed: 10/31/2017  Elsevier Patient Education ?? 2021 Elsevier Inc.    Transurethral Resection of the Prostate, Care After  This sheet gives you information about how to care for yourself after your procedure. Your health care provider may also give you more specific instructions. If you have problems or questions, contact your health care provider.  What can I expect after the procedure?  After the procedure, it is common to have:  ?? Mild pain in your lower abdomen.  ?? Soreness or mild discomfort in your penis from having the catheter inserted during the procedure.  ?? A feeling of urgency when you need to urinate.  ?? A small amount of blood in your urine. You may notice some small blood clots in your urine. These are normal.  Follow these instructions at home:  Medicines  ?? Take over-the-counter and prescription medicines only as told by your health care provider.  ?? If you were prescribed an antibiotic medicine, take it as told by your health care provider. Do not stop taking the antibiotic even if you start to feel better.  ?? Ask your health care provider if the medicine prescribed to you:  ? Requires you to avoid driving or using heavy machinery.  ? Can cause constipation. You may need to take actions to prevent or treat constipation, such as:  ?? Take over-the-counter or prescription medicines.  ?? Eat foods that are high in fiber, such as fresh fruits and vegetables, whole grains, and beans.  ?? Limit foods that are high in fat and processed sugars, such as fried or sweet foods.  ?? Do not drive for 24 hours if you were given a sedative during your procedure.  Activity    ?? Return to your normal activities as told by your health care provider. Ask your health care provider what activities are safe for you.  ?? Do not lift anything that is heavier than 10 lb (4.5 kg), or the limit that you are told, for 3 weeks after the  procedure or until your health care provider says that it is safe.  ?? Avoid intense physical activity for as long as told by your health care provider.  ?? Avoid sitting for a long time without moving. Get up and move around one or more times every few hours. This helps to prevent blood clots. You may increase your physical activity gradually as you start to feel better.  Lifestyle  ?? Do not drink alcohol for as long as told by your health care provider. This is especially important if you are taking prescription pain medicines.  ?? Do not engage in sexual activity until your health care provider says that you can do this.  General instructions    ?? Do not take baths, swim, or use a hot tub until your health care provider approves.  ?? Drink enough fluid to keep your urine pale yellow.  ?? Urinate as soon as you feel the need to. Do not try to hold your urine for long periods of time.  ?? If your health care provider approves, you may take a stool softener for 2-3 weeks to prevent you from straining to have a bowel movement.  ?? Wear compression stockings as told by your health care provider. These stockings help to prevent blood clots and reduce swelling in your legs.  ?? Keep all follow-up visits as told by your health care provider. This is important.  Contact a health care  provider if you have:  ?? Difficulty urinating.  ?? A fever.  ?? Pain that gets worse or does not improve with medicine.  ?? Blood in your urine that does not go away after 1 week of resting and drinking more fluids.  ?? Swelling in your penis or testicles.  Get help right away if:  ?? You are unable to urinate.  ?? You are having more blood clots in your urine instead of fewer.  ?? You have:  ? Large blood clots.  ? A lot of blood in your urine.  ? Pain in your back or lower abdomen.  ? Pain or swelling in your legs.  ? Chills and you are shaking.  ? Difficulty breathing or shortness of breath.  Summary  ?? After the procedure, it is common to have a small  amount of blood in your urine.  ?? Avoid heavy lifting and intense physical activity for as long as told by your health care provider.  ?? Urinate as soon as you feel the need to. Do not try to hold your urine for long periods of time.  ?? Keep all follow-up visits as told by your health care provider. This is important.  This information is not intended to replace advice given to you by your health care provider. Make sure you discuss any questions you have with your health care provider.  Document Revised: 05/22/2018 Document Reviewed: 10/31/2017  Elsevier Patient Education ?? 2021 [employer].

## 2020-04-21 DIAGNOSIS — N400 Enlarged prostate without lower urinary tract symptoms: Secondary | ICD-10-CM

## 2020-04-22 NOTE — Unmapped (Signed)
Called patient to discuss negative surgical patho results. No answer. I will call again later.

## 2020-05-18 ENCOUNTER — Other Ambulatory Visit: Payer: Self-pay | Admitting: Cardiology

## 2020-05-18 MED ORDER — SODIUM CHLORIDE 0.9% FLUSH: 3.0000 mL | Freq: Two times a day (BID) | INTRAVENOUS | Status: AC

## 2020-05-19 ENCOUNTER — Other Ambulatory Visit: Payer: Self-pay

## 2020-05-19 ENCOUNTER — Other Ambulatory Visit
Admission: RE | Admit: 2020-05-19 | Discharge: 2020-05-19 | Disposition: A | Discharge status: Home / Self Care | Payer: Medicare Other | Source: Ambulatory Visit | Attending: Cardiology | Admitting: Cardiology

## 2020-05-20 ENCOUNTER — Ambulatory Visit
Admission: RE | Admit: 2020-05-20 | Discharge: 2020-05-20 | Disposition: A | Discharge status: Home / Self Care | Payer: Medicare Other | Attending: Cardiology | Admitting: Cardiology

## 2020-05-20 ENCOUNTER — Encounter: Payer: Self-pay | Admitting: Cardiology

## 2020-05-20 ENCOUNTER — Other Ambulatory Visit
Admission: RE | Admit: 2020-05-20 | Discharge: 2020-05-20 | Disposition: A | Discharge status: Home / Self Care | Payer: Medicare Other | Source: Ambulatory Visit | Attending: Cardiology | Admitting: Cardiology

## 2020-05-20 ENCOUNTER — Other Ambulatory Visit: Payer: Self-pay

## 2020-05-20 ENCOUNTER — Encounter
Admission: RE | Disposition: A | Discharge status: Home / Self Care | Payer: Self-pay | Source: Home / Self Care | Attending: Cardiology

## 2020-05-20 DIAGNOSIS — Z955 Presence of coronary angioplasty implant and graft: Secondary | ICD-10-CM | POA: Diagnosis not present

## 2020-05-20 DIAGNOSIS — R079 Chest pain, unspecified: Secondary | ICD-10-CM

## 2020-05-20 DIAGNOSIS — Z20822 Contact with and (suspected) exposure to covid-19: Secondary | ICD-10-CM | POA: Insufficient documentation

## 2020-05-20 DIAGNOSIS — I25110 Atherosclerotic heart disease of native coronary artery with unstable angina pectoris: Secondary | ICD-10-CM | POA: Insufficient documentation

## 2020-05-20 HISTORY — PX: LEFT HEART CATH AND CORONARY ANGIOGRAPHY: CATH118249

## 2020-05-20 LAB — SARS CORONAVIRUS 2 BY RT PCR (HOSPITAL ORDER, PERFORMED IN ~~LOC~~ HOSPITAL LAB): SARS Coronavirus 2: NEGATIVE

## 2020-05-20 SURGERY — LEFT HEART CATH AND CORONARY ANGIOGRAPHY
Anesthesia: Moderate Sedation

## 2020-05-20 MED ORDER — HEPARIN SODIUM (PORCINE) 1000 UNIT/ML IJ SOLN
INTRAMUSCULAR | Status: AC
  Filled 2020-05-20: qty 1

## 2020-05-20 MED ORDER — ONDANSETRON HCL 4 MG/2ML IJ SOLN: 4.0000 mg | Freq: Four times a day (QID) | INTRAMUSCULAR | Status: DC | PRN

## 2020-05-20 MED ORDER — LIDOCAINE HCL (PF) 1 % IJ SOLN
INTRAMUSCULAR | Status: AC
  Filled 2020-05-20: qty 30

## 2020-05-20 MED ORDER — ACETAMINOPHEN 325 MG PO TABS: 650.0000 mg | ORAL_TABLET | ORAL | Status: DC | PRN

## 2020-05-20 MED ORDER — LABETALOL HCL 5 MG/ML IV SOLN: 10.0000 mg | INTRAVENOUS | Status: DC | PRN

## 2020-05-20 MED ORDER — SODIUM CHLORIDE 0.9 % IV SOLN: 250.0000 mL | INTRAVENOUS | Status: DC | PRN

## 2020-05-20 MED ORDER — SODIUM CHLORIDE 0.9% FLUSH: 3.0000 mL | INTRAVENOUS | Status: DC | PRN

## 2020-05-20 MED ORDER — ASPIRIN 81 MG PO CHEW: 81.0000 mg | CHEWABLE_TABLET | ORAL | Status: DC

## 2020-05-20 MED ORDER — IOHEXOL 300 MG/ML  SOLN
INTRAMUSCULAR | Status: DC | PRN
  Administered 2020-05-20: 81 mL

## 2020-05-20 MED ORDER — MIDAZOLAM HCL 2 MG/2ML IJ SOLN
INTRAMUSCULAR | Status: AC
  Filled 2020-05-20: qty 2

## 2020-05-20 MED ORDER — SODIUM CHLORIDE 0.9% FLUSH: 3.0000 mL | Freq: Two times a day (BID) | INTRAVENOUS | Status: DC

## 2020-05-20 MED ORDER — SODIUM CHLORIDE 0.9 % WEIGHT BASED INFUSION: 1.0000 mL/kg/h | INTRAVENOUS | Status: DC

## 2020-05-20 MED ORDER — FENTANYL CITRATE (PF) 100 MCG/2ML IJ SOLN
INTRAMUSCULAR | Status: DC | PRN
  Administered 2020-05-20: 25 ug via INTRAVENOUS

## 2020-05-20 MED ORDER — HEPARIN SODIUM (PORCINE) 1000 UNIT/ML IJ SOLN
INTRAMUSCULAR | Status: DC | PRN
  Administered 2020-05-20: 4000 [IU] via INTRAVENOUS

## 2020-05-20 MED ORDER — HYDRALAZINE HCL 20 MG/ML IJ SOLN: 10.0000 mg | INTRAMUSCULAR | Status: DC | PRN

## 2020-05-20 MED ORDER — HEPARIN (PORCINE) IN NACL 1000-0.9 UT/500ML-% IV SOLN
INTRAVENOUS | Status: DC | PRN
  Administered 2020-05-20: 1000 mL

## 2020-05-20 MED ORDER — VERAPAMIL HCL 2.5 MG/ML IV SOLN
INTRAVENOUS | Status: AC
  Filled 2020-05-20: qty 2

## 2020-05-20 MED ORDER — MIDAZOLAM HCL 2 MG/2ML IJ SOLN
INTRAMUSCULAR | Status: DC | PRN
  Administered 2020-05-20: 1 mg via INTRAVENOUS

## 2020-05-20 MED ORDER — VERAPAMIL HCL 2.5 MG/ML IV SOLN
INTRAVENOUS | Status: DC | PRN
  Administered 2020-05-20: 2.5 mg via INTRA_ARTERIAL

## 2020-05-20 MED ORDER — SODIUM CHLORIDE 0.9 % WEIGHT BASED INFUSION
3.0000 mL/kg/h | INTRAVENOUS | Status: AC
  Administered 2020-05-20: 3 mL/kg/h via INTRAVENOUS

## 2020-05-20 MED ORDER — FENTANYL CITRATE (PF) 100 MCG/2ML IJ SOLN
INTRAMUSCULAR | Status: AC
  Filled 2020-05-20: qty 2

## 2020-05-20 SURGICAL SUPPLY — 13 items
CATH INFINITI 5 FR JL3.5 (CATHETERS) ×2 IMPLANT
CATH INFINITI 5FR ANG PIGTAIL (CATHETERS) ×2 IMPLANT
CATH INFINITI JR4 5F (CATHETERS) ×2 IMPLANT
COVER EZ STRL 42X30 (DRAPES) ×2 IMPLANT
DEVICE RAD TR BAND REGULAR (VASCULAR PRODUCTS) ×2 IMPLANT
DRAPE BRACHIAL (DRAPES) ×2 IMPLANT
GLIDESHEATH SLEND SS 6F .021 (SHEATH) ×2 IMPLANT
GUIDEWIRE INQWIRE 1.5J.035X260 (WIRE) ×1 IMPLANT
INQWIRE 1.5J .035X260CM (WIRE) ×2
PACK CARDIAC CATH (CUSTOM PROCEDURE TRAY) ×2 IMPLANT
PROTECTION STATION PRESSURIZED (MISCELLANEOUS) ×2
SET ATX SIMPLICITY (MISCELLANEOUS) ×2 IMPLANT
STATION PROTECTION PRESSURIZED (MISCELLANEOUS) ×1 IMPLANT

## 2020-05-20 NOTE — Progress Notes (Signed)
Dr. Darrold Junker at bedside, speaking with pt. And his nephew (POA) re: cath results. Both verbalize understanding of conversation. Pt. To follow up with Dr. Lady Gary next Tues. Per MD orders.

## 2020-05-21 ENCOUNTER — Encounter: Payer: Self-pay | Admitting: Cardiology

## 2020-06-01 ENCOUNTER — Ambulatory Visit: Admit: 2020-06-15 | Payer: Medicare (Managed Care)

## 2020-06-01 ENCOUNTER — Ambulatory Visit: Admit: 2020-06-01 | Payer: Medicare (Managed Care) | Attending: Family

## 2020-06-01 DIAGNOSIS — R350 Frequency of micturition: Secondary | ICD-10-CM

## 2020-06-01 LAB — POC URINALYSIS
Bilirubin, UA: NEGATIVE mg/dL
Glucose, POC, UA: NEGATIVE mg/dL
Nitrite, POC, UA: NEGATIVE
Protein, POC, UA: 100 mg/dL — AB
Spec Grav, UA: >=1.03 (ref 1.005–1.035)
Urobilinogen, POC, UA: 0.2 EU/dL (ref 0.2–1.0)
pH, POC,UA: 6 (ref 5.0–8.0)

## 2020-06-01 MED ORDER — darifenacin (ENABLEX) 7.5 MG 24 hr tablet
7.5 | ORAL_TABLET | Freq: Every day | ORAL | 3 refills | 30.00000 days | Status: AC
Start: 2020-06-01 — End: 2020-07-19

## 2020-06-01 MED ORDER — solifenacinVESICARE5MGtablet
5 | ORAL_TABLET | Freq: Every day | ORAL | 3 refills | Status: AC
Start: 2020-06-01 — End: 2020-06-01

## 2020-06-01 NOTE — Unmapped (Signed)
Review of Systems   Constitutional: Negative for chills, fever and malaise/fatigue.   Genitourinary: Positive for frequency and urgency. Negative for dysuria, flank pain and hematuria.   Musculoskeletal: Positive for back pain. Negative for joint pain and myalgias.

## 2020-06-01 NOTE — Unmapped (Signed)
Chief Complaint   Patient presents with   ??? Follow-up          History of Present Illness  William Russell is a 85 y.o. male with hx of BPH s/p TURP on (04/19/20) with Dr.Mahdy. Here today for post op follow up.       Taking flomax  Frequency: 5-6x  Urgency: Yes, with UUI  Nocturia:1-2x  Pads: 2-3 ppd  Denies SUI  Denies dysuria, gross hematuria, flank pain, fevers       Pathology: Prostate tissue, excision:   -Benign prostatic hyperplasia with multifocal chronic inflammation.   -8 grams      Review of Systems  See MA note     Allergies  Oxybutynin    Medications  Outpatient Encounter Medications as of 06/01/2020   Medication Sig Dispense Refill   ??? acetaminophen (TYLENOL) 325 MG tablet Take 2 tablets (650 mg total) by mouth every 4 hours as needed. Indications: Pain 30 tablet 0   ??? allopurinol (ZYLOPRIM) 300 MG tablet Take 300 mg by mouth daily.     ??? aspirin 325 MG tablet Take 325 mg by mouth daily.     ??? atorvastatin (LIPITOR) 20 MG tablet TAKE 1 TABLET BY MOUTH ONE TIME A DAY AT BEDTIME  3   ??? buPROPion XL (WELLBUTRIN XL) 150 MG 24 hr tablet Take 150 mg by mouth every morning.            ??? calcium citrate-vitamin D (CITRACAL+D) 315-200 mg-unit Take 1 tablet by mouth daily.     ??? carbidopa-levodopa (SINEMET) 25-100 mg per tablet Take 3 tablets by mouth 4 times a day. 1080 tablet 3   ??? folic acid (FOLVITE) 400 MCG tablet Take 400 mcg by mouth daily.     ??? gabapentin (NEURONTIN) 100 MG capsule Take 1 capsule (100 mg total) by mouth at bedtime for 7 days, THEN 2 capsules (200 mg total) at bedtime for 7 days, THEN 3 capsules (300 mg total) at bedtime. 90 capsule 11   ??? glucosamine-chondroitin 500-400 mg tablet Take 1 tablet by mouth daily.     ??? hyalur ac/chond sul/colg II/AA (HYALURONIC ACID, CHOND-COLLGN, ORAL) Take 1 tablet by mouth every morning.     ??? ibuprofen (MOTRIN) 200 MG tablet Take 200 mg by mouth if needed for Pain.            ??? loratadine (CLARITIN) 10 mg tablet Take 10 mg by mouth daily as needed for  Allergies.     ??? metoprolol succinate (TOPROL-XL) 25 MG 24 hr tablet Take 25 mg by mouth at bedtime.            ??? montelukast (SINGULAIR) 10 mg tablet Take 10 mg by mouth every morning.            ??? multivit-min/FA/lycopen/lutein (CENTRUM SILVER MEN ORAL) Take by mouth.     ??? mupirocin (BACTROBAN) 2 % ointment if needed.            ??? niacin 100 MG tablet Take 500 mg by mouth daily with breakfast.            ??? omega-3 fatty acids-fish oil 300-1,000 mg capsule Take 1 capsule by mouth daily.     ??? omeprazole (PRILOSEC) 20 MG capsule Take 20 mg by mouth daily.            ??? oxymetazoline (AFRIN) 0.05 % nasal spray Use 2 sprays into each nostril 2 times a day.     ???  paroxetine (PAXIL) 30 MG tablet Take 30 mg by mouth every morning.            ??? polyethylene glycol (GLYCOLAX) 17 gram/dose powder Take 17 g by mouth daily. (Patient taking differently: Take 17 g by mouth if needed.       ) 510 g 11   ??? rivastigmine tartrate (EXELON) 6 MG capsule Take 1 capsule (6 mg total) by mouth 2 times a day. 180 capsule 3   ??? senna-docusate (SENNA-S) 8.6-50 mg per tablet Take 1 tablet by mouth 2 times a day. 30 tablet 0   ??? tamsulosin (FLOMAX) 0.4 mg Cap TAKE ONE CAPSULE BY MOUTH EVERY NIGHT AT BEDTIME 60 capsule 5     No facility-administered encounter medications on file as of 06/01/2020.        Histories  He has a past medical history of Benign prostatic hyperplasia, BPH (benign prostatic hyperplasia), CPAP (continuous positive airway pressure) dependence, Depression, Hearing aid worn, High cholesterol, HOH (hard of hearing), Hypertension, OSA (obstructive sleep apnea), Parkinson's disease (CMS Dx), PONV (postoperative nausea and vomiting), Wears glasses, and Wears hearing aid.    He has a past surgical history that includes Lumbar spine surgery; Cholecystectomy; Carpal tunnel release (Left); Shoulder surgery (Bilateral); OTHER SURGICAL HISTORY (Left); cystoscopy urodynamics (N/A, 10/15/2019); and Transurethral resection of prostate  (N/A, 04/19/2020).    His family history includes Cancer in his mother; Coronary artery disease in his mother; Other in his father.    He reports that he has never smoked. He has never used smokeless tobacco. He reports that he does not drink alcohol and does not use drugs.    The following portions of the patient's history were reviewed and updated as appropriate: allergies, current medications, past family history, past medical history, past social history, past surgical history and problem list.    Blood pressure 122/66, pulse 68, height 5' 8 (1.727 m), weight 187 lb (84.8 kg), SpO2 98 %.  Physical Exam  Constitutional:       General: He is not in acute distress.     Appearance: He is well-developed. He is not diaphoretic.   HENT:      Head: Normocephalic and atraumatic.   Eyes:      Pupils: Pupils are equal, round, and reactive to light.   Pulmonary:      Effort: Pulmonary effort is normal. No respiratory distress.   Abdominal:      Palpations: Abdomen is soft.   Musculoskeletal:         General: Normal range of motion.   Skin:     General: Skin is warm and dry.   Neurological:      Mental Status: He is alert and oriented to person, place, and time.   Psychiatric:         Behavior: Behavior normal.         Thought Content: Thought content normal.         Judgment: Judgment normal.              Assessment  BPH s/p TURP  -Still having urgency, frequency, and UUI  -Taking flomax     PVR: 11 ml   UA: blood large, pro, wbc small, ketones trace    Plan  Stop flomax. If symptoms continue after 1 week of stopping the flomax then start Enablex 7.5mg , daily for the OAB wet symptoms. We dicussed it can take up to 6 month for symptoms to resolve and in rare cases he  will need further treatment for the urinary symptoms.   Send urine culture today to ensure not UTI.   RTC 2- 3 month to follow on symptoms.        Medical Decision Making  The following items were considered in medical decision making:  Review / order clinical  lab tests  Review / order radiology tests  Review / order other diagnostic tests/interventions

## 2020-06-11 NOTE — Unmapped (Signed)
Left message.

## 2020-06-11 NOTE — Unmapped (Addendum)
Pt requesting to speak with CNP about issues he is having since he stopped taking Tamsulosin. Pt reports urinary frequency/urgency and incontinence.

## 2020-06-13 DEATH — deceased

## 2020-06-14 LAB — URINE CULTURE

## 2020-06-14 NOTE — Unmapped (Signed)
Left message.

## 2020-06-16 MED ORDER — nitrofurantoin, macrocrystal-monohydrate, (MACROBID) 100 MG capsule
100 | ORAL_CAPSULE | Freq: Two times a day (BID) | ORAL | 0 refills | Status: AC
Start: 2020-06-16 — End: 2020-09-26

## 2020-06-16 NOTE — Unmapped (Signed)
Patient informed and Rx sent to pharmacy.

## 2020-06-16 NOTE — Unmapped (Signed)
-----   Message from Marcos Eke, CNP sent at 06/16/2020 11:45 AM EDT -----  William Russell,   Please let the patient know the urine culture is positive. Please send in Macrobid 100mg  BID for 7 dayts.     Thank you, lauren   ----- Message -----  From: Interface, [facility] [place]  Sent: 06/15/2020   5:00 PM EDT  To: Marcos Eke, CNP

## 2020-07-19 ENCOUNTER — Ambulatory Visit: Admit: 2020-07-19 | Payer: Medicare (Managed Care) | Attending: Adult Health

## 2020-07-19 DIAGNOSIS — G20 Parkinson's disease: Secondary | ICD-10-CM

## 2020-07-19 NOTE — Unmapped (Signed)
Subjective:      Patient ID: LAMINE LATON is a 85 y.o. male.    Movement Disorders HPI       Handedness: Right  Chief Complaint: PD  Patient accompanied by and additional history obtained from: wife  Age at onset of symptoms: 32  Duration of symptoms: 5 years    Follow up for PD. Plan at last visit 04/13/2020:    1. Continue Sinemet IR 25/100 x 3 tab QID??  2. Continue rivastigmine 6 mg BID  3. Start gabapentin 100mg  QHS x1 week then 200mg  QHS x 1 week then 300mg  QHS (goal dose)  4. No need for EMG  5. PT referral  6. Follow up with Shela Nevin in 63mo and me in 68mo    Interview:   Accompanied by his wife. He tried taking gabapentin once a day but says it made him feel loopy.  He continues to have right hand weakness and paresthesia. He says fingers 1-3 are tingling. He says finger 4-5 are better. He has trouble making a fist. He is dropping objects. He has trouble putting his hearing aids in. He has trouble holding utensils. He is seeing a hand specialist soon.     He continues taking Sinemet 3 tablets QID. He denies any meaningful wearing off, but he says if he is more physically active it may wear off sooner. He is late taking the Sinemet at times. He reports left calf muscle twitching.  He denies pain. He continues to have oro buccal movements.  He is chewing his tongue move, which is painful. He has stutter steps. He reports occasional freezing of gait with his right foot.     Wife thinks cognition is slightly worse.  He continues taking rivastigmine 6 mg BID. He continues [place] and working part-time.    He is participating in PT 2x week.     He rarely has hallucinations.     He is sleeping well.     He continues using CBD for back pain and has found it helpful. He says he stopped taking for 10 days and paoin was worse. He denies CBD causing hallucinations.     ??  PD Quality Metrics  Falls (Review Each Visit): present  no new falls  Motor Complications (Review Each Visit): present      Depression (Review Annually): denies     Apathy (Review Annually): denies     Anxiety (Review Annually): denies     Cognitive Impairment (Review Annually): present      Autonomic Dysfunction (Review Annually): denies     Insomnia/Sleep Disturbance (Review Annually): denies     PD Dx Review (Review Annually): yes     PD Med and Surg Option (Review Annually): yes     PD Rehab Options (Review Annually): yes     PD Safety Counseling (Review Annually): yes       Other Symptoms  Gait Freezing: present  right foot  Excessive Sweating: denies     Urinary Incontinence: present  urgency        Dysphagia: denies     Dream Enactment Behavior: present  previously has fallen out of bed  Anosmia: present     Constipation: denies  previously this was a problem  Hypophonia: present     Hypomimia: present     Micrographia: present        Med Side Effects  Nausea: denies     Vomiting: denies  Lightheadedness: present  rarely  Fainting: denies     Daytime Sedation: denies     Dyskinesia: present  tongue  Hallucinations: present  rarely at night  Delusions: denies     Paranoia: denies     Impulse Control Disorder: denies       Motor Fluctuations  Onset of Benefit: 20-30 minutes  Duration of Benefit: 4hours  Wearing Off Prior to Next Dose: if late taking a dose or more physically active    Imaging          Histories:     He has a past medical history of Benign prostatic hyperplasia, BPH (benign prostatic hyperplasia), CPAP (continuous positive airway pressure) dependence, Depression, Hearing aid worn, High cholesterol, HOH (hard of hearing), Hypertension, OSA (obstructive sleep apnea), Parkinson's disease (CMS Dx), PONV (postoperative nausea and vomiting), Wears glasses, and Wears hearing aid.    He has a past surgical history that includes Lumbar spine surgery; Cholecystectomy; Carpal tunnel release (Left); Shoulder surgery (Bilateral); OTHER SURGICAL HISTORY (Left); cystoscopy urodynamics (N/A, 10/15/2019); and Transurethral  resection of prostate (N/A, 04/19/2020).    His family history includes Cancer in his mother; Coronary artery disease in his mother; Other in his father.    He reports that he has never smoked. He has never used smokeless tobacco. He reports that he does not drink alcohol and does not use drugs.    Review of Systems  Refer to HPI for review of systems documentation.  ROS was otherwise non-contributory.     Allergies:   Oxybutynin    Medications:     Outpatient Encounter Medications as of 07/19/2020   Medication Sig Dispense Refill   ??? allopurinol (ZYLOPRIM) 300 MG tablet Take 300 mg by mouth daily.     ??? aspirin 325 MG tablet Take 325 mg by mouth daily.     ??? atorvastatin (LIPITOR) 20 MG tablet TAKE 1 TABLET BY MOUTH ONE TIME A DAY AT BEDTIME  3   ??? buPROPion XL (WELLBUTRIN XL) 150 MG 24 hr tablet Take 150 mg by mouth. Every other day     ??? calcium citrate-vitamin D (CITRACAL+D) 315-200 mg-unit Take 1 tablet by mouth daily.     ??? carbidopa-levodopa (SINEMET) 25-100 mg per tablet Take 3 tablets by mouth 4 times a day. 1080 tablet 3   ??? finasteride (PROPECIA) 1 mg tablet Take 1 mg by mouth daily.     ??? folic acid (FOLVITE) 400 MCG tablet Take 400 mcg by mouth daily.     ??? glucosamine-chondroitin 500-400 mg tablet Take 1 tablet by mouth daily.     ??? hyalur ac/chond sul/colg II/AA (HYALURONIC ACID, CHOND-COLLGN, ORAL) Take 1 tablet by mouth every morning.     ??? ibuprofen (MOTRIN) 200 MG tablet Take 200 mg by mouth if needed for Pain.            ??? loratadine (CLARITIN) 10 mg tablet Take 10 mg by mouth daily as needed for Allergies.     ??? metoprolol succinate (TOPROL-XL) 25 MG 24 hr tablet Take 25 mg by mouth at bedtime.            ??? montelukast (SINGULAIR) 10 mg tablet Take 10 mg by mouth every morning.            ??? multivit-min/FA/lycopen/lutein (CENTRUM SILVER MEN ORAL) Take by mouth.     ??? mupirocin (BACTROBAN) 2 % ointment if needed.            ??? niacin 100 MG  tablet Take 500 mg by mouth daily with breakfast.             ??? nitrofurantoin, macrocrystal-monohydrate, (MACROBID) 100 MG capsule Take 1 capsule (100 mg total) by mouth 2 times a day. 14 capsule 0   ??? omega-3 fatty acids-fish oil 300-1,000 mg capsule Take 1 capsule by mouth daily.     ??? omeprazole (PRILOSEC) 20 MG capsule Take 20 mg by mouth daily.            ??? oxymetazoline (AFRIN) 0.05 % nasal spray Use 2 sprays into each nostril 2 times a day.     ??? paroxetine (PAXIL) 30 MG tablet Take 30 mg by mouth every morning.            ??? polyethylene glycol (GLYCOLAX) 17 gram/dose powder Take 17 g by mouth daily. (Patient taking differently: Take 17 g by mouth if needed.) 510 g 11   ??? rivastigmine tartrate (EXELON) 6 MG capsule Take 1 capsule (6 mg total) by mouth 2 times a day. 180 capsule 3   ??? gabapentin (NEURONTIN) 100 MG capsule Take 1 capsule (100 mg total) by mouth at bedtime for 7 days, THEN 2 capsules (200 mg total) at bedtime for 7 days, THEN 3 capsules (300 mg total) at bedtime. 90 capsule 11   ??? [DISCONTINUED] acetaminophen (TYLENOL) 325 MG tablet Take 2 tablets (650 mg total) by mouth every 4 hours as needed. Indications: Pain 30 tablet 0   ??? [DISCONTINUED] darifenacin (ENABLEX) 7.5 MG 24 hr tablet Take 1 tablet (7.5 mg total) by mouth daily. 30 tablet 3   ??? [DISCONTINUED] senna-docusate (SENNA-S) 8.6-50 mg per tablet Take 1 tablet by mouth 2 times a day. 30 tablet 0     No facility-administered encounter medications on file as of 07/19/2020.       Objective:         Vitals:    07/19/20 1019   BP: 100/60   Pulse: 73   SpO2: 97%   Weight: 188 lb (85.3 kg)   Height: 5' 8 (1.727 m)       MDS UPDRS part 3 - Motor Examination  MDS UPDRS III Motor Examination  Date medication last taken: 07/19/20  Time medication last taken : 0800  Medication taken : Sinemet 300mg   Medication : ON  General  3.1 Speech: 1  3.2 Facial Expression: 0  Rigidity  3.3a Rigidity- Neck: 0  3.3b Rigidity right upper: 0  3.3c Rigidity left upper: 0  3.3d Rigidity right lower: 0  3.3e Rigidity left  lower: 0  Coordination   3.4a Finger tapping, right : 1  3.4b Finger tapping, left: 1  3.5a Hand movement, right: 2  3.5b Hand movement, left: 1  3.6a Pronation-supination of right hand: 3  3.6b Pronation-supination of left hand: 3  3.7a Toe tapping, right : 3  3.7b Toe Tapping, left: 2  3.8a Leg agility, right: 0  3.8b Leg agility, left: 0  Posture and Gait  3.9 Arising from chair : 1  3.10 Gait: 2  3.11 Freezing of gait: 0  3.12 Postural Stability: 1  3.13 Posture: 2  3.14 Body bradykinesia: 0  Tremor  3.15a Postural tremor, right hand: 0  3.15b Postural Tremor, left hand: 0  3.16a Kinetic Tremor of right hand: 1  3.16b Kinetic tremor of left hand: 1  3.17e Lip/jaw rest tremor: 0  3.17a RUE Rest Tremor Amplitude: 0  3.17b LUE Rest Tremor Amplitude: 0  3.17c RLE Rest Tremor  Amplitude: 0  3.17d LLE Rest Tremor Amplitude: 0  3.17e Constancy of Rest tremor: 1  DYSKINESIA IMPACT ON PART III RATINGS  Were dyskinesias (chorea or dystonia) present during examination?: Yes  If yes, did these movements interfere with your ratings?: No  MDS UPDRS 3 Total: 26  Comments: perioral dyskiensia       Assessment:     1. Parkinson's disease (CMS Dx)    2. Dyskinesia    ??  #PD. Parkinsonism is relatively well controlled. Higher dose of levodopa risks outweigh benefits (hallucinations, dyskinesia, lightheadedness).  His oro buccal dyskinesia are bothersome, he is biting his tongue. His mobility and range of motion limited by arthritis and spine disease.   ??  #cognitive decline. Rivastigmine appears to be helpful, although subjectively worse today. He rarely has hallucinations.   ??  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 Developed painful tingling and numbness in the median nerve distribution and subtle right APB weakness immediately after an injection of some kind directly over the median nerve at the wrist. This is most likely nerve injury from the injection rather than true carpal tunnel syndrome. No need  for EMG. Trial of gabapentin for neuropathic pain cause loopy feeling.  Clinical symptoms have been stable since onset, perhaps a little better today.     Plan:     1. Decrease Sinemet IR 25/100 from 3 tab QID??to 3 tabs alternating with 2.5 tabs QID (8a, 12p, 4p, 8pm) to help improve dyskinesia without worsening tremors and gait.   2. Continue rivastigmine 6 mg BID.  3. Follow up with Dr. Rema Jasmine in 3-4 months.           Portion of this note have been copied and forward but have been reviewed and updated if needed and are accurate to date    I have spent 30 minutes face to face with this patient with greater than 50% of this time spent in counseling and coordination of care discussing the above issues.  See Assessment and Plan for details.    Lenn Sink, CNP

## 2020-07-19 NOTE — Unmapped (Signed)
Decrease Sinemet IR 25/100  3 tabs alternating with 2.5 tabs at (8a, 12p, 4p, 8pm) to help improve dyskinesia without worsening tremors and gait.

## 2020-07-22 NOTE — Unmapped (Signed)
Pt requesting refill on finasteride and solifenacin to [facility] (in chart). Pt states he is completely out of medication.

## 2020-07-22 NOTE — Unmapped (Signed)
No notation is last OV from Sander Radon, NP that patient is taking Solifenacin.  Left message for patient.

## 2020-07-23 NOTE — Unmapped (Signed)
Discussed with [name].  If patient has been taking Solifenacin instead of Enablex and it is working, he can continue.  Left message to discuss.

## 2020-07-26 NOTE — Unmapped (Signed)
Left another message.

## 2020-08-11 ENCOUNTER — Ambulatory Visit: Payer: Medicare (Managed Care) | Attending: Family

## 2020-08-11 NOTE — Unmapped (Signed)
Pt calling to report that he needs to cancel appt today w/[name] Durene Cal as he had an accident and was in the ED until 2Am. Pt is req a call back to resched.

## 2020-08-16 NOTE — Unmapped (Signed)
MRN: [id number]   Specialty: Neurology    Patient Name: William Russell     Patient Date of Birth: Mar 17, 1935     Relationship of Caller to Patient: wife [name]  [phone]    Patient of: Grant Ruts of Call: Pt tested positive for Covid yesterday wife would like anti viral called in for pt symptoms 7/2 positive home test 7/3    On Call Provider Contacted: [place]     Provider contacted via pager or cell Mobile    Time and Method: called @  1436    Caller advised to call back in 30 minutes, if they do not hear from the provider on-call.  Note was routed to the receiving on-call provider/pool.

## 2020-09-06 MED ORDER — finasteride (PROPECIA) 1 mg tablet
1 | ORAL_TABLET | Freq: Every day | ORAL | 5 refills | Status: AC
Start: 2020-09-06 — End: 2020-10-06

## 2020-09-17 NOTE — Unmapped (Signed)
Patient's wife [name] is requesting to speak to the MD regarding the patient falling 3 times, 2 in one day and 1 the next day. [name] states the patient was seen in the ER due to some scratches on his forehead. [name] said xrays and CT scan was done. Please call [name] to discuss this. [name] wants to discuss the Carbidopa-levodopa and if the patient needs to be seen sooner than 11/16/2020. [name] states the patient is doing better. Please advise     [name] [phone]

## 2020-09-20 NOTE — Unmapped (Signed)
Called spouse, no answer. LVM for spouse to call back.

## 2020-09-22 NOTE — Unmapped (Signed)
Called to speak with [name]. I received no answer so I left a vm for a return call.      PHONE ROOM: If [name] calls back please schedule her  appointment from Dr. Yong Channel recommendation.

## 2020-09-23 NOTE — Unmapped (Signed)
Pt calling to req a call from CNP. Pt is req a refill of solifenacin. He is also asking if he can swing by and see CNP  for a few minutes. Pt has another appt in the building tomorrow at 11.

## 2020-09-23 NOTE — Unmapped (Signed)
PT OK w/ appt 8/12 @ 11am. Unable to schedule or reach Kim. Please advise.

## 2020-09-23 NOTE — Unmapped (Signed)
Patient was given Finasteride 1 mg tablets (5/day).  Spoke with [name] to verify.  Patient can take this prescription as 5 per day.  Patient will call when he is due for a refill and we can switch to 5 mg tablets.

## 2020-09-23 NOTE — Unmapped (Signed)
Called and spoke to [place] and [name]. I scheduled them the appt. I also let them know that I am here at [facility]. I gave them directions and the address to our building. Patient and wife verbalized understanding.  Call complete

## 2020-09-23 NOTE — Unmapped (Signed)
Called to speak with William Russell. I gave him my appt times for 09/24/20 here in [facility]. He stated that his wife will want to come to this visit with him and her will give me call back.    PHONE ROOM: When William Russell calls back please scheduled him an appt for 09/24/20 with Dr. Rema Jasmine at [facility].

## 2020-09-23 NOTE — Unmapped (Signed)
Pt has questions about finasteride rx.

## 2020-09-24 ENCOUNTER — Inpatient Hospital Stay: Admit: 2020-09-24

## 2020-09-24 ENCOUNTER — Ambulatory Visit: Admit: 2020-09-24 | Discharge: 2020-11-05 | Payer: Medicare (Managed Care) | Attending: Neurology

## 2020-09-24 DIAGNOSIS — G959 Disease of spinal cord, unspecified: Secondary | ICD-10-CM

## 2020-09-24 MED ORDER — diazePAM (VALIUM) 5 MG tablet
5 | ORAL_TABLET | Freq: Four times a day (QID) | ORAL | 0 refills | Status: AC | PRN
Start: 2020-09-24 — End: 2020-10-08

## 2020-09-24 NOTE — Unmapped (Signed)
Referred by: Anson Oregon MD  Reason for Referral: Social work assessment     Biopsychosocial Assessment    Presenting Problem:  William Russell is a 84 y.o. married White or Caucasian male diagnosed with Parkinson's disease.     Living Situation:  The patient lives at home with his wife.     Support System:  The patients spouse.     Support Person:  Name William Russell    Relationship to Patient Spouse  Phone Number [phone]     Transportation:  The  patient is still driving, future planning for cessation of driving was discussed.     Level of Functioning/Activities of Daily Living  ADL List:  Mobility:  Some assistance  Toileting:  Independent  Bathing:  Some assistance  Grooming:  Some assistance  Dressing:  Some assistance  Eating:  Some assistance  Bill Pay:  Some assistance  Shopping:  Some assistance  Meal Preparation:  Dependent  Driving or Transportation Arrangements:  Some assistance  Medication Management:  Independent  Using Phone:  Independent    Durable Medical/Adaptive Equipment  Grab bar and shower chair added to new walk in shower.     Current Living Arrangements:  Lives with Spouse    Environment:  Number of Stairs in the Home: Patient doesn't use stairs to second story  Number of Levels in the Home: 2  Location of Bathroom in the Home: Main floor with grab bar  Number of Stairs to Access the Home: n/a          Identified Need(s) and Intervention(s):    Needs: Case Management/Care Coordination  Interventions:Community Services Referral(s): Driving evaluation options were discussed and the family was referred to [employer] [facility] [employer].    Clinical Impressions:  William Russell is a 85 y.o. married White or Caucasian male diagnosed with Parkinson's disease. The patient attended the assessment with his wife and daughter, and the family was happy with the visit. The patient presented with normal affect and normal range. The patient is experiencing falls at home, getting in and out of his  vehicle, and while at work. The patient is having increased difficulties with his activities of daily living. The patient has begun using his left hand instead of his right due to difficulty using his right hand for activities of daily living. The patient will benefit from support from occupational therapy, further driving evaluation, and additional support at home to reduce and prevent caregiver fatigued.       Follow Up Plan:   Driving resources were mailed to the family after the visit today. The family will follow up with social work as additional support is needed. Occupational therapy needs were communicated to the patients neurology team.     Patient/Family are aware of options and participated in decision making/plan.      Plan communicated to Anson Oregon MD    William Russell  Social Work Student Intern   [phone]

## 2020-09-24 NOTE — Unmapped (Addendum)
It was a pleasure seeing you today in [facility].    We discussed that we believe your spine is causing your walking problems.    We want you to get an MRI of your neck.  Call [phone] to schedule.    We want you to get an EMG to look at your muscles and nerves.    Dr. Rema Jasmine would like to see you again in 3 - 4 months.

## 2020-09-24 NOTE — Unmapped (Signed)
Subjective:      Patient ID: William Russell is a 85 y.o. male.    Movement Disorders HPI       Handedness: Right  Chief Complaint: PD  Patient accompanied by and additional history obtained from: wife  Age at onset of symptoms: 22  Duration of symptoms: 5 years    William Russell returns for his PD.  He was last seen 2 months ago with William Russell, at which time his Sinemet was slightly reduced in the hopes of improving his OBL dyskinesias.    He has been falling more.  He feels that his balance is worse.  He fell 3 times in 2 days and had a tetanus shot after the second shot.  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  He often trips when getting out of the jeep he drives.  His wife rides with him.  Another time he tripped over a bar on a tractor.  One time he lost his balance just while walking.  Sometimes he will cross his legs when walking and that leads to difficulty.  He also feels that his left knee will give out on him at times, and he is worried about that.  Two of the screws in his back have broken but he is not a candidate for surgery to correct this.  He uses his walker in the house but a cane when outside.    His tremor is suppressed well if he takes his medication on time.  He felt that his tremor was worse on the 3 - 2.5 - 3 -2.5 so he went back to 3 QID.  The reduction did not improve his BOL movements.    His PT has been interrupted by COVID spells and his PT being very busy.  His PT is Armed forces operational officer [employer]) who is evidently very good.  He is looking to ramp it back up soon.    His Sinemet take 30 - 40 minutes to kick in.    He has been noticing fasciculations in his legs, more on the left than the right.    Spine Surgeon is Queen Slough, MD (Neurosurgery) with [facility]      ??  PD Quality Metrics  Falls (Review Each Visit): present  no new falls  Motor Complications (Review Each Visit): present     Depression (Review Annually): denies     Apathy (Review Annually):  denies     Anxiety (Review Annually): denies     Cognitive Impairment (Review Annually): present      Autonomic Dysfunction (Review Annually): denies     Insomnia/Sleep Disturbance (Review Annually): denies     PD Dx Review (Review Annually): yes     PD Med and Surg Option (Review Annually): yes     PD Rehab Options (Review Annually): yes     PD Safety Counseling (Review Annually): yes       Other Symptoms  Gait Freezing: present  right foot  Excessive Sweating: denies     Urinary Incontinence: present  urgency        Dysphagia: denies     Dream Enactment Behavior: present  previously has fallen out of bed  Anosmia: present     Constipation: denies  previously this was a problem  Hypophonia: present     Hypomimia: present     Micrographia: present        Med Side Effects  Nausea: denies     Vomiting: denies  Lightheadedness: present  rarely  Fainting: denies     Daytime Sedation: denies     Dyskinesia: present  tongue  Hallucinations: present  rarely at night  Delusions: denies     Paranoia: denies     Impulse Control Disorder: denies       Motor Fluctuations  Onset of Benefit: 20-30 minutes  Duration of Benefit: 4hours  Wearing Off Prior to Next Dose: if late taking a dose or more physically active    Imaging          Histories:     He has a past medical history of Benign prostatic hyperplasia, BPH (benign prostatic hyperplasia), CPAP (continuous positive airway pressure) dependence, Depression, Hearing aid worn, High cholesterol, HOH (hard of hearing), Hypertension, OSA (obstructive sleep apnea), Parkinson's disease (CMS Dx), PONV (postoperative nausea and vomiting), Wears glasses, and Wears hearing aid.    He has a past surgical history that includes Lumbar spine surgery; Cholecystectomy; Carpal tunnel release (Left); Shoulder surgery (Bilateral); OTHER SURGICAL HISTORY (Left); cystoscopy urodynamics (N/A, 10/15/2019); and Transurethral resection of prostate (N/A, 04/19/2020).    His family history includes Cancer  in his mother; Coronary artery disease in his mother; Other in his father.    He reports that he has never smoked. He has never used smokeless tobacco. He reports that he does not drink alcohol and does not use drugs.    Review of Systems  Refer to HPI for review of systems documentation.  ROS was otherwise non-contributory.     Allergies:   Oxybutynin and Penicillins    Medications:     Outpatient Encounter Medications as of 09/24/2020   Medication Sig Dispense Refill   ??? allopurinol (ZYLOPRIM) 300 MG tablet Take 300 mg by mouth daily.     ??? aspirin 325 MG tablet Take 325 mg by mouth daily.     ??? atorvastatin (LIPITOR) 20 MG tablet TAKE 1 TABLET BY MOUTH ONE TIME A DAY AT BEDTIME  3   ??? buPROPion XL (WELLBUTRIN XL) 150 MG 24 hr tablet Take 150 mg by mouth. Every other day     ??? calcium citrate-vitamin D (CITRACAL+D) 315-200 mg-unit Take 1 tablet by mouth daily.     ??? carbidopa-levodopa (SINEMET) 25-100 mg per tablet Take 3 tablets by mouth 4 times a day. 1080 tablet 3   ??? finasteride (PROPECIA) 1 mg tablet Take 5 tablets (5 mg total) by mouth daily for 30 days. 30 tablet 5   ??? fluticasone propionate (FLONASE) 50 mcg/actuation nasal spray Use 1 spray into each nostril daily.     ??? folic acid (FOLVITE) 400 MCG tablet Take 400 mcg by mouth daily.     ??? glucosamine-chondroitin 500-400 mg tablet Take 1 tablet by mouth daily.     ??? hyalur ac/chond sul/colg II/AA (HYALURONIC ACID, CHOND-COLLGN, ORAL) Take 1 tablet by mouth every morning.     ??? ibuprofen (MOTRIN) 200 MG tablet Take 200 mg by mouth if needed for Pain.            ??? loratadine (CLARITIN) 10 mg tablet Take 10 mg by mouth daily as needed for Allergies.     ??? metoprolol succinate (TOPROL-XL) 25 MG 24 hr tablet Take 25 mg by mouth at bedtime.            ??? montelukast (SINGULAIR) 10 mg tablet Take 10 mg by mouth every morning.            ??? multivit-min/FA/lycopen/lutein (CENTRUM SILVER MEN ORAL) Take by  mouth.     ??? mupirocin (BACTROBAN) 2 % ointment if needed.             ??? niacin 100 MG tablet Take 500 mg by mouth daily with breakfast.            ??? omega-3 fatty acids-fish oil 300-1,000 mg capsule Take 1 capsule by mouth daily.     ??? omeprazole (PRILOSEC) 20 MG capsule Take 20 mg by mouth daily.            ??? paroxetine (PAXIL) 30 MG tablet Take 30 mg by mouth every morning.            ??? polyethylene glycol (GLYCOLAX) 17 gram/dose powder Take 17 g by mouth daily. (Patient taking differently: Take 17 g by mouth if needed.) 510 g 11   ??? rivastigmine tartrate (EXELON) 6 MG capsule Take 1 capsule (6 mg total) by mouth 2 times a day. 180 capsule 3   ??? solifenacin (VESICARE) 5 MG tablet Take 5 mg by mouth daily.     ??? diazePAM (VALIUM) 5 MG tablet Take 1 tablet (5 mg total) by mouth every 6 hours as needed for Anxiety for up to 1 dose. Take 1 tablet 30 minutes before MRI.  Do not take within 24 hrs of any other pain medication. 1 tablet 0   ??? [DISCONTINUED] gabapentin (NEURONTIN) 100 MG capsule Take 1 capsule (100 mg total) by mouth at bedtime for 7 days, THEN 2 capsules (200 mg total) at bedtime for 7 days, THEN 3 capsules (300 mg total) at bedtime. 90 capsule 11   ??? [DISCONTINUED] nitrofurantoin, macrocrystal-monohydrate, (MACROBID) 100 MG capsule Take 1 capsule (100 mg total) by mouth 2 times a day. 14 capsule 0   ??? [DISCONTINUED] oxymetazoline (AFRIN) 0.05 % nasal spray Use 2 sprays into each nostril 2 times a day.       No facility-administered encounter medications on file as of 09/24/2020.       Objective:         Vitals:    09/24/20 1126   BP: 130/67   BP Location: Left arm   Patient Position: Sitting   BP Cuff Size: Regular   Pulse: 68   Weight: 183 lb 8 oz (83.2 kg)   Height: 5' 8 (1.727 m)     There are fasciculations prominently in both quads, hamstrings, calves, and anterior lower leg muscles.  There are much less frequently fasciculations in the triceps bilaterally, I did not see any in the muscles of his back (I had him take his shirt off).    MOTOR: Normal  bulk.   R L   Deltoid 4- 4-   Biceps 5 5   Triceps 5 5   Wrist Extensor 5 5   Wrist Flexor 5 5   Interosseous 5 5   Grip 5 5    R L   Hip Flexor 5 5   Quadriceps  5 5   Hamstrings 5 5   Ankle Plantar Flex 5 5   Ankle Dorsiflex 5 5   Dysarthria: none.    DEEP TENDON REFLEX:  Biceps 3+ 3+   Triceps 3+ 3+   Brachioradialis 3+ 3+   Patellar 3+ 3+   Achilles 3+ 3+   Plantar reflex Down Down    Hoffman's: Present bilaterally.  Clonus: 2 beats on R, none on L.    MDS UPDRS part 3 - Motor Examination  MDS UPDRS III Motor Examination  Date medication  last taken: 09/24/20  Time medication last taken : 0800  Medication taken : Sinemet  Medication : OFF  General  3.1 Speech: 2  3.2 Facial Expression: 2  Rigidity  3.3a Rigidity- Neck: 1  3.3b Rigidity right upper: 1  3.3c Rigidity left upper: 1  3.3d Rigidity right lower: 1  3.3e Rigidity left lower: 0  Coordination   3.4a Finger tapping, right : 2  3.4b Finger tapping, left: 3  3.5a Hand movement, right: 2  3.5b Hand movement, left: 2  3.6a Pronation-supination of right hand: 3  3.6b Pronation-supination of left hand: 3  3.7a Toe tapping, right : 3  3.7b Toe Tapping, left: 3  3.8a Leg agility, right: 2  3.8b Leg agility, left: 2  Posture and Gait  3.9 Arising from chair : 1  3.10 Gait: 2  3.11 Freezing of gait: 1  3.12 Postural Stability: 2  3.13 Posture: 2  3.14 Body bradykinesia: 1  Tremor  3.15a Postural tremor, right hand: 0  3.15b Postural Tremor, left hand: 0  3.16a Kinetic Tremor of right hand: 0  3.16b Kinetic tremor of left hand: 0  3.17e Lip/jaw rest tremor: 0  3.17a RUE Rest Tremor Amplitude: 0  3.17b LUE Rest Tremor Amplitude: 0  3.17c RLE Rest Tremor Amplitude: 0  3.17d LLE Rest Tremor Amplitude: 1  3.17e Constancy of Rest tremor: 1     MDS UPDRS 3 Total: 44       Trendelenburg gait on the left.       Assessment:     1. Myelopathy (CMS Dx)    2. Spinal stenosis of lumbosacral region    3. Parkinson's disease (CMS Dx)    4. Fasciculations    5. Carpal tunnel  syndrome of right wrist    ??  #PD. Parkinsonism is relatively well controlled. His MSK and radicular disease makes his bradykinesia look worse than it truly is.  ??  #Cognitive Decline. Rivastigmine appears to be helpful and he is still working. He rarely has hallucinations.   ??  #Cervical Myelopathy and Lumbar Spine Stenosis: He has hyperreflexia, hoffman's sign, continuous fasciculations in the legs, and rapidly deteriorating gait.  This is most likely explained by a tandem stenosis of his C-Spine causing myelopathy and L-Spine Stenosis.  This was indeed shown on his recent CT's from his ED visit.  ALS is on the differential but less likely given the absence of atrophy and weakness and the symmetric signs consistent with myelopathy.    Plan:     1. Continue Sinemet IR 25/100 from 3 tab QID.  2. Continue rivastigmine 6 mg BID.  3. MRI C-Spine for myelopathy  4. Myelopathy Labs: B12, MMA, Copper, HIV, Syphilis  5. EMG BLE and R arm.  6. Continue with PT.  7. Home Health OT  8. Tentatively plan to refer him to previous spine surgeon pending results of MRI and EMG   9. Follow up with Dr. Rema Jasmine in 3-4 months.     Anson Oregon, MD  Movement Disorders Fellow PGY 5  Neurology Department  12:42 PM 09/24/2020    I saw and examined the patient on 09/24/2020, and discussed the case with the fellow and agree with the findings and plan as documented in the fellow's note. I was present and supervised the procedure described above.     Sung Amabile, MD  Assistant Professor  Neurology

## 2020-10-01 NOTE — Unmapped (Signed)
Pt is returning Ashleys call.

## 2020-10-01 NOTE — Unmapped (Signed)
Left voicemail asking patient to return call. Patient requesting a refill for Solifenacin, however there is no notation that he is taking Solifenacin. From Telephone encounter on 6/9, per [name] okay for patient to continue taking Solifenacin instead of Enablex if it is working. No noted documentation that patient confirmed this.

## 2020-10-01 NOTE — Unmapped (Signed)
Spoke with patient.  He has been taking Solifenacin 5 mg daily.  Refill sent to [name] to sign.

## 2020-10-04 MED ORDER — solifenacin (VESICARE) 5 MG tablet
5 | ORAL_TABLET | ORAL | 3 refills | Status: AC
Start: 2020-10-04 — End: ?

## 2020-10-08 ENCOUNTER — Inpatient Hospital Stay: Payer: Medicare (Managed Care) | Attending: Student in an Organized Health Care Education/Training Program

## 2020-10-08 NOTE — Unmapped (Signed)
Patient called requesting to speak with Dr.Kuhlman. Patient is confused about the tests he needs done. Please advise

## 2020-10-11 NOTE — Unmapped (Signed)
Called to speak with Jim. I received no answer so I left a vm for a return call.

## 2020-10-12 NOTE — Unmapped (Signed)
Patient called requesting to speak with Dr.Kuhlman. Please advise

## 2020-10-12 NOTE — Unmapped (Signed)
Attempted to call pt, no answer. LVM for pt to call back

## 2020-10-13 NOTE — Unmapped (Signed)
Called to speak with Jim. I received no answer so I left a vm for a return call.

## 2020-10-14 ENCOUNTER — Ambulatory Visit: Admit: 2020-10-14 | Discharge: 2020-10-27 | Payer: Medicare (Managed Care) | Attending: Neurology

## 2020-10-14 DIAGNOSIS — M5417 Radiculopathy, lumbosacral region: Secondary | ICD-10-CM

## 2020-10-14 NOTE — Unmapped (Signed)
Patient: William Russell DOB: 11/25/1935 Physician: Wyonia Hough, MD    Sex: Male Height: 5' 8 Ref Phys: Joslyn Hy   ID#: [id number] Weight:  lbs. Technician: Herma Carson       Test Date: 10/14/2020     Indication: Gait abnormalities    Impression:   Abnormal complex study:  There is electrodiagnostic evidence of:    1. 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  2. A severe right median mononeuropathy at the wrist which correlates with his hand numbness and is consistent with carpal tunnel syndrome  3. A chronic active right C6 radiculopathy with ongoing denervation  4. A chronic left L2-4 radiculopathy without ongoing denervation  5. A chronic active left S1 radiculopathy with ongoing denervation    Unable to rule out an underlying motor neuron disease as there is involvement of the thoracic paraspinals, however, a structural spine disease is favored.  Clinical correlation is recommended.  Results were discussed with the patient.         History: William Russell, William Russell is a 85 year old left handed Male with pmh of parkinson's disease and prior lumbar surgery who was referred for worsening gait issues and imbalance.  Over the past few months he has been feeling more imbalanced and has had more difficulty walking.  He has chronic low back pain along with chronic neck pain.  He denies weakness.  PMH: no diabetes PSH: lumbar spine surgery Anticoagulation: no anticoagulation    Exam  A focused neuromuscular exam demonstrated full strength throughout.  Reflexes were 3s in the BUE extremities and 2+ in the BLE, no jaw jerk present, no clonus.  There were continuous fasciculations in the bilateral ATs and gastrocs.    NCV & EMG Findings:  Evaluation of the right Fibular EDB* motor nerve showed reduced amplitude (Poplt, 0.8 mV).  The right median motor nerve showed prolonged distal onset latency (11.6 ms), reduced amplitude (Elbow, 1.2 mV), and decreased conduction velocity (Wrist-Elbow, 44  m/s).  The right Ulnar ADM* motor nerve showed reduced amplitude (A Elbow, 4.6 mV).  The right median sensory nerve showed no response (Wrist).  The right radial sensory nerve showed reduced amplitude (16.9 ??V).  The right ulnar sensory nerve showed no response (Wrist).  All remaining nerves (as indicated in the following tables) were within normal limits.      Needle evaluation was performed on select muscles of the right upper extremity, left lower extremity and thoracic paraspinals using a concentric needle.  Insertional activity was increased in the deltoid, gastroc, AT, short head of the biceps femoris and thoracic paraspinals with varying degrees of positive waves, fibrillations and fasciculations.  There was also a complex repetitive discharge in the thoracic paraspinals.  Insertional/spontaneous activity was normal in all other muscles tested.  Voluntary motor units of the triceps, infraspinatus, deltoid, iliopsoas, vastus medialis, gastroc and short head of the biceps femoris demonstrated varying degrees of decreased recruitment with increased amplitude and increased duration.  Voluntary motor units were normal in all other muscles tested.    Limitations:   None    ___________________________  Wyonia Hough, MD       ______________________________        Nerve Conduction Studies  Anti Sensory Summary Table     Stim Site NR Peak (ms) Norm Peak (ms) O-P Amp (??V) Norm O-P Amp Site1 Site2 Delta-0 (ms) Dist (cm) Vel (m/s) Norm Vel (m/s)   Right Median Anti Sensory (2nd Digit)  31.6 ??C  Wrist NR  <3.6  >14.9 Wrist 2nd Digit  13.0     Right Radial Anti Sensory (Base 1st Digit)  32 ??C   Wrist    2.6 <2.9 16.9 >19.9 Wrist Base 1st Digit 1.8 10.0 56    Right Sural Anti Sensory (Lat Mall)  22.2 ??C   Site 2   NR             Right Ulnar Anti Sensory (5th Digit)  31.4 ??C   Wrist NR  <3.1  >9.9 Wrist 5th Digit  11.0     Site 2 NR               Motor Summary Table     Stim Site NR Onset (ms) Norm Onset (ms) O-P Amp  (mV) Norm O-P Amp Site1 Site2 Delta-0 (ms) Dist (cm) Vel (m/s) Norm Vel (m/s)   Right Fibular EDB* Motor (EDB)  28.1 ??C   Ankle    4.6 <7.1 1.1  Ankle B Fib 6.1 30.0 49 >41   B Fib    10.7  0.9  B Fib Poplt 3.2 13.0 41 >41   Poplt    13.9  0.8 >1.9             8.3  0.0          Right Median Motor (APB)  27.2 ??C   Wrist    11.6 <4.5 2.1  Wrist Elbow 6.1 27.0 44 >48   Elbow    17.7  1.2 >3.9         Right Tibial Motor (Abd Hall Brev)  23.2 ??C   Ankle    4.0 <6.1 2.0          Right Ulnar ADM* Motor (ADM)  31.9 ??C   Wrist    2.7 <3.4 7.9  Wrist B Elbow 4.2 23.0 55 >53   B Elbow    6.9  5.3  B Elbow A Elbow 2.0 9.5 48 >42   A Elbow    8.9  4.6 >6.5         martin gurber    8.5  1.2            EMG 10x20     Side Muscle Ins Act Fibs Psw Fasc Other Amp Dur Poly Recrt Comment   Right 1stDorInt Nml Nml Nml None None Nml Nml 0 Nml    Right FlexCarRad Nml Nml Nml None None Nml Nml 0 Nml    Right Biceps Nml Nml Nml None None Nml Nml 0 Nml    Right Triceps Nml Nml Nml None None 1+ 1+ 0 Nml    Right Infraspinatus Nml Nml Nml None None 1+ 1+ 0 Nml    Right Deltoid Incr 1+ 1+ None None 1+ 1+ 0 Red    Left AntTibialis Incr Nml Nml 2+ None Nml Nml 0 Nml    Left Gastroc Incr 1+ 1+ 1+ None 1+ 1+ 0 Nml    Left Iliopsoas Nml Nml Nml None None 1+ 1+ 0 Red    Left VastusMed Nml Nml Nml None None 2+ 2+ 0 Red    Left BicepsFemS Incr 1+ 1+ None None 1+ 1+ 0 Red    Left GluteusMed Nml Nml Nml None None Nml Nml 0 Nml    Left Thoracic Parasp Mid Incr 1+ 1+ None CRD                Waveforms:

## 2020-10-22 NOTE — Unmapped (Signed)
I called and spoke with [name] about William Russell back.  They said that they recently saw his Neurosurgeon Dr. Chinita Greenland who did not believe he was a surgical candidate.  It is unclear to me how much this involved a clear assessment of his obvious cervical myelopathy.  They are reticent about surgery and are worried he wouldn't tolerate it.  I discussed getting an MRI and potentially getting a second neurosurgical opinion.    They would liek to do the scans at [facility].    Anson Oregon, MD  Movement Disorders Fellow PGY 6  Neurology Department  1:19 PM 10/22/2020

## 2020-10-26 NOTE — Unmapped (Signed)
Left message

## 2020-10-26 NOTE — Unmapped (Signed)
finasteride (PROPECIA) 1 mg tablet [    Pt says he needs more than prescribed. He is going to the pharmacy every 5-6 days he says.

## 2020-11-02 MED ORDER — finasteride (PROSCAR) 5 mg tablet
5 | ORAL_TABLET | Freq: Every day | ORAL | 6 refills | Status: AC
Start: 2020-11-02 — End: ?

## 2020-11-02 NOTE — Unmapped (Signed)
Left message prescription for Finasteride 5 mg tablets will be sent in today.

## 2020-11-02 NOTE — Unmapped (Addendum)
Wife asking if finasteride rx can be switched 5 mg daily because pt is currently taking 5 1 mg tablets a day and filling script every 6 days. Wife states this is very inconvenient. Firefighter (in chart).

## 2020-11-05 NOTE — Unmapped (Signed)
I called William Russell back. She explains that William Russell has been suffering from falls with increasing frequency and less ability to brace his falls with his hands. He fell on Wednesday and landed on his face because he could not brace his fall. He is also incontinent. Admitted to [facility] and imaging revealed severe cervical cord compression. Providers there recommended surgical decompression on Monday morning. His arms are weak (new). Given this new information I agreed with the hospital team's recommendation for surgical decompression. He may have complications from the surgery and anesthesia but if left unaddressed he is likely to develop quadriparesis and other complications from cervical cord injury. She voiced understanding and they plan to proceed with the surgery.

## 2020-11-05 NOTE — Unmapped (Signed)
Pt's daughter Baxter Hire is calling to report that the pt is currently at [facility] due to experiencing a fall on Wednesday. Baxter Hire states the pt has broken his nose and has cervical spinal stenosis. Baxter Hire states the doctors there want to do spinal decompression surgery due to the possibility of losing control of his arms. Baxter Hire is concerned about this due to the pt's diagnosis and needs to discuss whether or not this is a safe procedure. Baxter Hire states the pt currently is not able to use his left arm and the right arm is very weak since the fall. Baxter Hire states the pt is scheduled for surgery on Monday, it is a 4 hours plus surgery for decompression and a fusion. Baxter Hire lastly states she does not know if this is the best choice since the pt's diagnosis of Parkinson's and he is 85 years old. Pls call Baxter Hire to discuss this further.       Ph [phone]

## 2020-11-16 ENCOUNTER — Ambulatory Visit: Payer: Medicare (Managed Care) | Attending: Neurology

## 2020-11-22 ENCOUNTER — Ambulatory Visit: Payer: Medicare (Managed Care) | Attending: Adult Health

## 2020-11-22 NOTE — Unmapped (Deleted)
Subjective:      Patient ID: William Russell is a 85 y.o. male.    Movement Disorders HPI                     years      He had had ER visit in September for fall and landed on his face. Admitted to [facility] and imaging revealed severe cervical cord compression    His arms are week.         PD Quality Metrics                                                                            Other Symptoms                                                                       Med Side Effects                                                                Motor Fluctuations             Imaging          Histories:     He has a past medical history of Benign prostatic hyperplasia, BPH (benign prostatic hyperplasia), CPAP (continuous positive airway pressure) dependence, Depression, Hearing aid worn, High cholesterol, HOH (hard of hearing), Hypertension, OSA (obstructive sleep apnea), Parkinson's disease (CMS Dx), PONV (postoperative nausea and vomiting), Wears glasses, and Wears hearing aid.    He has a past surgical history that includes Lumbar spine surgery; Cholecystectomy; Carpal tunnel release (Left); Shoulder surgery (Bilateral); OTHER SURGICAL HISTORY (Left); cystoscopy urodynamics (N/A, 10/15/2019); and Transurethral resection of prostate (N/A, 04/19/2020).    His family history includes Cancer in his mother; Coronary artery disease in his mother; Other in his father.    He reports that he has never smoked. He has never used smokeless tobacco. He reports that he does not drink alcohol and does not use drugs.    Review of Systems  Refer to HPI for review of systems documentation.  ROS was otherwise non-contributory.     Allergies:   Oxybutynin and Penicillins    Medications:     Outpatient Encounter Medications as of 11/22/2020   Medication Sig Dispense Refill   ??? allopurinol (ZYLOPRIM) 300 MG tablet Take 300 mg by mouth daily.     ??? aspirin 325 MG tablet Take 325 mg by mouth daily.     ??? atorvastatin (LIPITOR) 20 MG  tablet TAKE 1 TABLET BY MOUTH ONE TIME A DAY AT BEDTIME  3   ??? buPROPion XL (WELLBUTRIN XL) 150 MG 24 hr tablet Take 150 mg by mouth. Every other day     ??? calcium citrate-vitamin  D (CITRACAL+D) 315-200 mg-unit Take 1 tablet by mouth daily.     ??? carbidopa-levodopa (SINEMET) 25-100 mg per tablet Take 3 tablets by mouth 4 times a day. 1080 tablet 3   ??? finasteride (PROSCAR) 5 mg tablet Take 1 tablet (5 mg total) by mouth daily. 30 tablet 6   ??? fluticasone propionate (FLONASE) 50 mcg/actuation nasal spray Use 1 spray into each nostril daily.     ??? folic acid (FOLVITE) 400 MCG tablet Take 400 mcg by mouth daily.     ??? glucosamine-chondroitin 500-400 mg tablet Take 1 tablet by mouth daily.     ??? hyalur ac/chond sul/colg II/AA (HYALURONIC ACID, CHOND-COLLGN, ORAL) Take 1 tablet by mouth every morning.     ??? ibuprofen (MOTRIN) 200 MG tablet Take 200 mg by mouth if needed for Pain.            ??? loratadine (CLARITIN) 10 mg tablet Take 10 mg by mouth daily as needed for Allergies.     ??? metoprolol succinate (TOPROL-XL) 25 MG 24 hr tablet Take 25 mg by mouth at bedtime.            ??? montelukast (SINGULAIR) 10 mg tablet Take 10 mg by mouth every morning.            ??? multivit-min/FA/lycopen/lutein (CENTRUM SILVER MEN ORAL) Take by mouth.     ??? mupirocin (BACTROBAN) 2 % ointment if needed.            ??? niacin 100 MG tablet Take 500 mg by mouth daily with breakfast.            ??? omega-3 fatty acids-fish oil 300-1,000 mg capsule Take 1 capsule by mouth daily.     ??? omeprazole (PRILOSEC) 20 MG capsule Take 20 mg by mouth daily.            ??? paroxetine (PAXIL) 30 MG tablet Take 30 mg by mouth every morning.            ??? polyethylene glycol (GLYCOLAX) 17 gram/dose powder Take 17 g by mouth daily. (Patient taking differently: Take 17 g by mouth if needed.) 510 g 11   ??? rivastigmine tartrate (EXELON) 6 MG capsule Take 1 capsule (6 mg total) by mouth 2 times a day. 180 capsule 3   ??? solifenacin (VESICARE) 5 MG tablet TAKE ONE TABLET BY  MOUTH DAILY 30 tablet 3   ??? [DISCONTINUED] acetaminophen (TYLENOL) tablet      ??? [DISCONTINUED] atorvastatin (LIPITOR) tablet      ??? [DISCONTINUED] bacitracin zinc ointment      ??? [DISCONTINUED] carbidopa-levodopa (SINEMET) 25-100 mg per tablet      ??? [DISCONTINUED] finasteride (PROSCAR) tablet      ??? [DISCONTINUED] GENERIC EXTERNAL MEDICATION      ??? [DISCONTINUED] GENERIC EXTERNAL MEDICATION      ??? [DISCONTINUED] GENERIC EXTERNAL MEDICATION      ??? [DISCONTINUED] GENERIC EXTERNAL MEDICATION      ??? [DISCONTINUED] methocarbamoL (ROBAXIN) tablet      ??? [DISCONTINUED] metoprolol succinate (TOPROL-XL) 24 hr tablet      ??? [DISCONTINUED] pantoprazole (PROTONIX) EC tablet      ??? [DISCONTINUED] PARoxetine (PAXIL) tablet      ??? [DISCONTINUED] polyethylene glycol (GLYCOLAX) powder      ??? [DISCONTINUED] rivastigmine tartrate (EXELON) capsule      ??? [DISCONTINUED] solifenacin (VESICARE) tablet        No facility-administered encounter medications on file as of 11/22/2020.  Objective:       There were no vitals filed for this visit.    MDS UPDRS part 3 - Motor Examination                       MDS UPDRS 3            Assessment:     No diagnosis found.    ***    Plan:     ***          Portion of this note have been copied and forward but have been reviewed and updated if needed and are accurate to date.    I have spent *** minutes face to face with this patient with greater than 50% of this time spent in counseling and coordination of care discussing the above issues.  See Assessment and Plan for details.    Lenn Sink, CNP

## 2021-02-21 ENCOUNTER — Ambulatory Visit: Admit: 2021-02-21 | Discharge: 2021-02-21 | Payer: MEDICARE | Attending: Neurology

## 2021-02-21 DIAGNOSIS — G20 Parkinson's disease: Secondary | ICD-10-CM

## 2021-02-21 MED ORDER — carbidopa-levodopa (SINEMET) 25-100 mg per tablet
25-100 | ORAL_TABLET | ORAL | 3 refills | Status: AC
Start: 2021-02-21 — End: ?

## 2021-02-21 MED ORDER — rivastigmine tartrate (EXELON) 6 MG capsule
6 | ORAL_CAPSULE | Freq: Two times a day (BID) | ORAL | 3 refills | Status: AC
Start: 2021-02-21 — End: ?

## 2021-02-21 NOTE — Unmapped (Signed)
[name] calling  Pt had major surgery on back and can not get to appt Today. She would like to speak to office about getting appt changed to video or telephone  Can you call Vanessa Kickjan and let her ASAP     [name] [phone]

## 2021-02-21 NOTE — Unmapped (Signed)
Subjective:      Patient ID: William Russell is a 86 y.o. male.    Movement Disorders HPI       Handedness: Right  Chief Complaint: PD  Patient accompanied by and additional history obtained from: wife  Age at onset of symptoms: 50  Duration of symptoms: 6 years    He underwent cervical spine decompression surgery in 10/2020 after a fall (fell face forward and fractured his nose). He developed numbness/tingling in bilateral hands and feet after the fall. He was not able to walk after the fall. Also has urinary incontinence (present prior to fall). Admitted to hospital and evaluated by trauma, neurology, and other providers who recommended surgery. He was admitted to [facility] for 3 days then rehab for 6 weeks. He then did an additional 1 week after Medicare coverage ran out for PT/OT after review by an outside physician. Then he went to a different SNF location and continued PT/OT at [facility] point but did not see as much benefit this time. He now has persistent right hand numbness but the other numbness resolved. He is not able to walk. He needs assistance to feed himself. He is unable to open items, use his phone. This is confounded by multiple rotator cuff surgeries. He is using a wheelchair. He fell out of his wheelchair about 1 week ago prompting visit to [facility]. He is back to living at home now.     He did develop confusion while at rehab and found to have a UTI that was treated with an antibiotic with improved confusion after completed the antibiotic.    Sinemet IR 25/100 has been reduced from 3 tablet QID to alternating 3 tablet with 2.5 tablet QID because of confusion. The confusion has improved with a slightly lower dose. He only has wearing off if he does not get Sinemet on the 4 hour schedule. This manifests as a tremor that can be severe and problematic if left unaddressed. He does not notice wearing off pattern based on time of day.     The only other medication change since  last visit is the addition of a muscle relaxant medication that was stopped when discharged to home.     The muscle twitching (fasciculations) have improved since surgery.     PD Quality Metrics  Falls (Review Each Visit): present     Motor Complications (Review Each Visit): present     Depression (Review Annually): denies     Apathy (Review Annually): denies     Anxiety (Review Annually): denies     Cognitive Impairment (Review Annually): present      Autonomic Dysfunction (Review Annually): denies     Insomnia/Sleep Disturbance (Review Annually): denies     PD Dx Review (Review Annually): yes     PD Med and Surg Option (Review Annually): yes     PD Rehab Options (Review Annually): yes     PD Safety Counseling (Review Annually): yes       Other Symptoms  Gait Freezing: present  right foot  Excessive Sweating: denies     Urinary Incontinence: present  urgency        Dysphagia: denies     Dream Enactment Behavior: present  previously has fallen out of bed  Anosmia: present     Constipation: present     Hypophonia: present     Hypomimia: present     Micrographia: present        Med Side Effects  Nausea: denies     Vomiting: denies     Lightheadedness: denies     Fainting: denies     Daytime Sedation: denies     Dyskinesia: present  tongue  Hallucinations: denies     Delusions: denies     Paranoia: denies     Impulse Control Disorder: denies       Motor Fluctuations             Imaging       Etiol Risk                               Histories:     Past Medical History:   Diagnosis Date   ??? Benign prostatic hyperplasia    ??? BPH (benign prostatic hyperplasia)    ??? CPAP (continuous positive airway pressure) dependence     hasn't used for a few months d/t recall   ??? Depression    ??? Hearing aid worn    ??? High cholesterol    ??? HOH (hard of hearing)    ??? Hypertension    ??? OSA (obstructive sleep apnea)     uses CPAP   ??? Parkinson's disease (CMS Dx)    ??? PONV (postoperative nausea and vomiting)    ??? Wears glasses    ??? Wears  hearing aid     bilat       Past Surgical History:   Procedure Laterality Date   ??? CARPAL TUNNEL RELEASE Left    ??? CHOLECYSTECTOMY     ??? CYSTOSCOPY URODYNAMICS N/A 10/15/2019    Procedure: URODYNAMICS;  Surgeon: Marcos Eke, CNP;  Location: [facility] OR [facility];  Service: Urology;  Laterality: N/A;   ??? LUMBAR SPINE SURGERY     ??? OTHER SURGICAL HISTORY Left     left ear surgery for otosclosis   ??? SHOULDER SURGERY Bilateral     right shoulder x2   ??? TRANSURETHRAL RESECTION OF PROSTATE N/A 04/19/2020    Procedure: Cystoscopy, Transurethral resection of the prostate;  Surgeon: [name], MD, PhD;  Location: [facility] OR;  Service: Urology;  Laterality: N/A;       Family History   Problem Relation Age of Onset   ??? Cancer Mother    ??? Coronary artery disease Mother    ??? Other Father    ??? Parkinsonism Neg Hx        Social History     Socioeconomic History   ??? Marital status: Married     Spouse name: Not on file   ??? Number of children: Not on file   ??? Years of education: Not on file   ??? Highest education level: Not on file   Occupational History   ??? Not on file   Tobacco Use   ??? Smoking status: Never   ??? Smokeless tobacco: Never   Substance and Sexual Activity   ??? Alcohol use: No   ??? Drug use: No     Comment: CBD oil   ??? Sexual activity: Not on file   Other Topics Concern   ??? Caffeine Use Yes   ??? Occupational Exposure No   ??? Exercise Yes   ??? Seat Belt Yes   Social History Narrative   ??? Not on file     Social Determinants of Health     Financial Resource Strain: Not on file   Physical Activity: Not on file   Stress: Not on file  Social Connections: Not on file   Housing Stability: Not on file       Review of Systems  Refer to HPI for review of systems documentation. All other systems reviewed and negative.     Allergies:   Oxybutynin and Penicillins    Medications:     Current Outpatient Medications:   ???  allopurinol (ZYLOPRIM) 300 MG tablet, Take 1 tablet (300 mg total) by mouth daily., Disp: , Rfl:   ???  aspirin 325 MG tablet, Take 1  tablet (325 mg total) by mouth daily., Disp: , Rfl:   ???  atorvastatin (LIPITOR) 20 MG tablet, TAKE 1 TABLET BY MOUTH ONE TIME A DAY AT BEDTIME, Disp: , Rfl: 3  ???  buPROPion XL (WELLBUTRIN XL) 150 MG 24 hr tablet, Take 1 tablet (150 mg total) by mouth. Every other day, Disp: , Rfl:   ???  calcium citrate-vitamin D (CITRACAL+D) 315-200 mg-unit, Take 1 tablet by mouth daily., Disp: , Rfl:   ???  carbidopa-levodopa (SINEMET) 25-100 mg per tablet, Take 3 tablets at 8am and 4pm. Take 2 and 1/2 tablets at 12pm and 8pm., Disp: 990 tablet, Rfl: 3  ???  finasteride (PROSCAR) 5 mg tablet, Take 1 tablet (5 mg total) by mouth daily., Disp: 30 tablet, Rfl: 6  ???  fluticasone propionate (FLONASE) 50 mcg/actuation nasal spray, Use 1 spray into each nostril daily as needed., Disp: , Rfl:   ???  folic acid (FOLVITE) 400 MCG tablet, Take 1 tablet (400 mcg total) by mouth daily., Disp: , Rfl:   ???  glucosamine-chondroitin 500-400 mg tablet, Take 1 tablet by mouth daily., Disp: , Rfl:   ???  hyalur ac/chond sul/colg II/AA (HYALURONIC ACID, CHOND-COLLGN, ORAL), Take 1 tablet by mouth every morning., Disp: , Rfl:   ???  ibuprofen (MOTRIN) 200 MG tablet, Take 1 tablet (200 mg total) by mouth if needed for Pain., Disp: , Rfl:   ???  loratadine (CLARITIN) 10 mg tablet, Take 1 tablet (10 mg total) by mouth daily as needed for Allergies., Disp: , Rfl:   ???  metoprolol succinate (TOPROL-XL) 25 MG 24 hr tablet, Take 1 tablet (25 mg total) by mouth at bedtime., Disp: , Rfl:   ???  montelukast (SINGULAIR) 10 mg tablet, Take 1 tablet (10 mg total) by mouth every morning., Disp: , Rfl:   ???  multivit-min/FA/lycopen/lutein (CENTRUM SILVER MEN ORAL), Take by mouth., Disp: , Rfl:   ???  mupirocin (BACTROBAN) 2 % ointment, if needed.    , Disp: , Rfl:   ???  niacin 100 MG tablet, Take 5 tablets (500 mg total) by mouth daily with breakfast., Disp: , Rfl:   ???  omega-3 fatty acids-fish oil 300-1,000 mg capsule, Take 1 capsule by mouth daily., Disp: , Rfl:   ???  omeprazole  (PRILOSEC) 20 MG capsule, Take 1 capsule (20 mg total) by mouth daily., Disp: , Rfl:   ???  paroxetine (PAXIL) 30 MG tablet, Take 1 tablet (30 mg total) by mouth every morning., Disp: , Rfl:   ???  polyethylene glycol (GLYCOLAX) 17 gram/dose powder, Take 17 g by mouth daily. (Patient taking differently: Take 17 g by mouth if needed.), Disp: 510 g, Rfl: 11  ???  rivastigmine tartrate (EXELON) 6 MG capsule, Take 1 capsule (6 mg total) by mouth 2 times a day., Disp: 180 capsule, Rfl: 3  ???  solifenacin (VESICARE) 5 MG tablet, TAKE ONE TABLET BY MOUTH DAILY, Disp: 30 tablet, Rfl: 3    Objective:  There were no vitals filed for this visit.    Limited neurological examination due to video visit.        Assessment:     1. Cognitive decline    2. Parkinson disease (CMS Dx)    3. Lumbosacral radiculopathy at S1    4. Motor fluctuations related to medication use in Parkinson's disease (CMS Dx)    5. Dyskinesia    6. Myelopathy (CMS Dx)    7. Carpal tunnel syndrome of right wrist    8. Visual hallucination    9. Cervical cord compression with myelopathy (CMS Dx)      1. PD. Motor fluctuations and dyskinesia well controlled. Cognitive decline stabilized with rivastigmine and slightly lower dose of Sinemet. His immobility is driven more by cervical myelopathy and various peripheral nervous system conditions listed below. He and wife prefer to see a neurologist closer to home in [place].   2. 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 Numbness and weakness progressed after fall in 10/2020 then stabilized after surgical decompression of the cervical spine at [facility]. Now he is wheelchair bound. He missed previous appointment at hand clinic for median mononeuropathy but plans to call to reschedule initial visit.      Plan:     1. Continue Sinemet IR 25/100 x alternating 3 tab and 2.5 tab QID  2. Continue rivastigmine 6mg  BID  3. Call [facility] clinic for right CTS  4. Referral to [facility] per patient request  5. Follow up with me in 6 months but can cancel when/if he can be seen at [facility] sooner    Sung Amabile, MD  Assistant Professor  Neurology         This was a Video visit, including two-way audio and video communication, in lieu of an in-person visit. The patient provided verbal consent to participate in the telehealth visit.   I spent 35 minutes speaking with the patient, conducting an interview, performing a limited exam, and educating the patient on my assessment and plan. I also spent 10 minutes, on the same day as the encounter, preparing to see the patient (eg, review of tests), ordering medications, tests, or procedures, referring and communicating with other health care professionals  and documenting clinical information in the electronic or other health record.

## 2021-02-21 NOTE — Unmapped (Signed)
Spoke to William Russell and let her know that I was converting this appt to a video visit. She understood and thanked me for letting her know.   Call complete

## 2021-03-04 NOTE — Unmapped (Signed)
Movement Disorder social worker contacted the patient's daughter Baxter HireKristen to discuss in home care and options for additional support. The family is paying for private 24 hour care for the patient, are interested in a hoyer lift, and a recumbent bike for the patient. Resources for foundation funds to assist with covering medical supplies was provided to the patient's daughter via e-mail. The family was encouraged to contact a local medical supply company to discuss a hoyer lift and incontinence supplies. The family will contact this Clinical research associatewriter for additional support or resources if needed.     Madilyn FiremanBecca Jacobs, MSW, LISW-S  Movement Disorder Social Worker   [phone]

## 2021-03-09 MED ORDER — external catheter, male Misc
Freq: Every evening | 11 refills | Status: AC
Start: 2021-03-09 — End: 2021-03-10

## 2021-03-10 MED ORDER — external catheter, male Misc
Freq: Every evening | 11 refills | Status: AC
Start: 2021-03-10 — End: 2021-03-10

## 2021-03-10 NOTE — Unmapped (Signed)
Addended by: Dreama Saa D on: 03/10/2021 01:08 PM     Modules accepted: Orders

## 2021-03-10 NOTE — Unmapped (Signed)
Spoke with patient and she actually has refill.

## 2021-03-10 NOTE — Unmapped (Signed)
Pt's wife calling to report that pt has been in a SNF due to a fall and spinal decompression. Pt is now home and wife  is asking for refills of solifenasin and  finasteride. She is unsure if CNP wants pt to continue to take these meds.

## 2021-03-10 NOTE — Unmapped (Signed)
Movement Disorder social worker contacted the patient's daughter, William Russell to further discuss home health aides. A voicemail was left requesting a call back if additional information was needed.     Porfirio Oar, MSW, LSW, CSW  Movement Disorder Social Worker  [phone]

## 2021-04-08 MED ORDER — QUEtiapineSEROQUEL25MGtablet
25 | ORAL_TABLET | ORAL | 11 refills | Status: AC
Start: 2021-04-08 — End: ?

## 2021-04-08 NOTE — Unmapped (Signed)
Patient's daughter Barkley Bruns called stating the patient has been having Hallucination and agitated for the past week. Baxter Hire states the patient had taken 5 days of  Cipro for a possible UTI.  Baxter Hire states the UTI results came back Negative    Baxter Hire states these symptoms has been going on for about a week.      Please advise     Belenda Cruise   [phone]  Caller has given verbal consent to leave a detailed message on voicemail if unavailable.
